# Patient Record
Sex: Female | Born: 1970 | Race: Black or African American | Hispanic: No | Marital: Married | Smoking: Former smoker
Health system: Southern US, Community
[De-identification: ages and names within clinical notes are randomized; demographics above are authoritative.]

## PROBLEM LIST (undated history)

## (undated) DIAGNOSIS — S83209A Unspecified tear of unspecified meniscus, current injury, unspecified knee, initial encounter: Secondary | ICD-10-CM

## (undated) DIAGNOSIS — M1990 Unspecified osteoarthritis, unspecified site: Secondary | ICD-10-CM

## (undated) DIAGNOSIS — I10 Essential (primary) hypertension: Secondary | ICD-10-CM

## (undated) HISTORY — DX: Morbid (severe) obesity due to excess calories: E66.01

## (undated) HISTORY — DX: Unspecified osteoarthritis, unspecified site: M19.90

## (undated) HISTORY — DX: Unspecified tear of unspecified meniscus, current injury, unspecified knee, initial encounter: S83.209A

## (undated) HISTORY — PX: TUBAL LIGATION: SHX77

---
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
## 2005-10-20 ENCOUNTER — Ambulatory Visit: Payer: Self-pay | Admitting: Family Medicine

## 2010-10-15 ENCOUNTER — Ambulatory Visit: Payer: Self-pay | Admitting: Family Medicine

## 2013-02-21 ENCOUNTER — Ambulatory Visit: Payer: Self-pay | Admitting: Family Medicine

## 2013-11-01 ENCOUNTER — Ambulatory Visit: Payer: Self-pay | Admitting: Family Medicine

## 2015-01-05 ENCOUNTER — Ambulatory Visit: Payer: Self-pay | Admitting: Family Medicine

## 2015-02-09 ENCOUNTER — Ambulatory Visit (INDEPENDENT_AMBULATORY_CARE_PROVIDER_SITE_OTHER): Payer: 59 | Admitting: Family Medicine

## 2015-02-09 ENCOUNTER — Encounter: Payer: Self-pay | Admitting: Family Medicine

## 2015-02-09 VITALS — BP 124/76 | HR 70 | Temp 98.2°F | Resp 16 | Ht 62.0 in | Wt 228.9 lb

## 2015-02-09 DIAGNOSIS — E66813 Obesity, class 3: Secondary | ICD-10-CM

## 2015-02-09 DIAGNOSIS — Z13220 Encounter for screening for lipoid disorders: Secondary | ICD-10-CM | POA: Diagnosis not present

## 2015-02-09 DIAGNOSIS — Z23 Encounter for immunization: Secondary | ICD-10-CM | POA: Insufficient documentation

## 2015-02-09 DIAGNOSIS — Z0000 Encounter for general adult medical examination without abnormal findings: Secondary | ICD-10-CM | POA: Insufficient documentation

## 2015-02-09 DIAGNOSIS — M654 Radial styloid tenosynovitis [de Quervain]: Secondary | ICD-10-CM | POA: Insufficient documentation

## 2015-02-09 DIAGNOSIS — Z1239 Encounter for other screening for malignant neoplasm of breast: Secondary | ICD-10-CM | POA: Insufficient documentation

## 2015-02-09 DIAGNOSIS — Z87898 Personal history of other specified conditions: Secondary | ICD-10-CM | POA: Insufficient documentation

## 2015-02-09 DIAGNOSIS — R030 Elevated blood-pressure reading, without diagnosis of hypertension: Secondary | ICD-10-CM | POA: Diagnosis not present

## 2015-02-09 DIAGNOSIS — L732 Hidradenitis suppurativa: Secondary | ICD-10-CM | POA: Insufficient documentation

## 2015-02-09 DIAGNOSIS — Z87891 Personal history of nicotine dependence: Secondary | ICD-10-CM | POA: Insufficient documentation

## 2015-02-09 DIAGNOSIS — M25529 Pain in unspecified elbow: Secondary | ICD-10-CM | POA: Insufficient documentation

## 2015-02-09 HISTORY — DX: Obesity, class 3: E66.813

## 2015-02-09 HISTORY — DX: Morbid (severe) obesity due to excess calories: E66.01

## 2015-02-09 NOTE — Progress Notes (Signed)
Name: Judy Robinson   MRN: [id number]    DOB: [birth year]   Date:02/09/2015       Progress Note  Subjective  Chief Complaint  Chief Complaint  Patient presents with  . Annual [place] form filled out    HPI   Judy Robinson is a 44 year old female with medical picture significant for obesity, pre-HTN, intermitent joint pain here for work physical and lab work that needs to be completed. She reports no acute concerns today. Judy Robinson has been doing well with no major changes to her health. She continues with weight loss efforts but walking and watching her diet but finds that she has not lost much weight. Previously smoking a handful of cigarettes now and then but has quite some time ago.   Patient Active Problem List   Diagnosis Date Noted  . Blood pressure elevated without history of HTN 02/09/2015  . H/O disease 02/09/2015  . Elbow pain 02/09/2015  . De Quervain's disease (radial styloid tenosynovitis) 02/09/2015  . Acne inversa 02/09/2015  . Obesity, Class III, BMI 40-49.9 (morbid obesity) 02/09/2015  . Need for diphtheria-tetanus-pertussis (Tdap) vaccine, adult/adolescent 02/09/2015  . Screening cholesterol level 02/09/2015  . Breast cancer screening 02/09/2015  . History of cigarette smoking 02/09/2015    History  Substance Use Topics  . Smoking status: Never Smoker   . Smokeless tobacco: Not on file  . Alcohol Use: 0.0 oz/week    0 Standard drinks or equivalent per week     Comment: ocassional    No current outpatient prescriptions on file.  Past Surgical History  Procedure Laterality Date  . Cesarean section    . Tubal ligation      Family History  Problem Relation Age of Onset  . Hypertension Mother   . Thyroid disease Mother   . Diabetes Brother   . Heart attack Brother     No Known Allergies  Depression screen [facility] 2/9 02/09/2015  Decreased Interest 0  Down, Depressed, Hopeless 0  PHQ - 2 Score 0    Review of  Systems  CONSTITUTIONAL: No significant weight changes, fever, chills, weakness or fatigue.  HEENT:  - Eyes: No visual changes.  - Ears: No auditory changes. No pain.  - Nose: No sneezing, congestion, runny nose. - Throat: No sore throat. No changes in swallowing. SKIN: No rash or itching.  CARDIOVASCULAR: No chest pain, chest pressure or chest discomfort. No palpitations or edema.  RESPIRATORY: No shortness of breath, cough or sputum.  GASTROINTESTINAL: No anorexia, nausea, vomiting. No changes in bowel habits. No abdominal pain or blood.  GENITOURINARY: No dysuria. No frequency. No discharge.  NEUROLOGICAL: No headache, dizziness, syncope, paralysis, ataxia, numbness or tingling in the extremities. No memory changes. No change in bowel or bladder control.  MUSCULOSKELETAL: No joint pain. No muscle pain. HEMATOLOGIC: No anemia, bleeding or bruising.  LYMPHATICS: No enlarged lymph nodes.  PSYCHIATRIC: No change in mood. No change in sleep pattern.  ENDOCRINOLOGIC: No reports of sweating, cold or heat intolerance. No polyuria or polydipsia.     Objective  BP 124/76 mmHg  Pulse 70  Temp(Src) 98.2 F (36.8 C) (Oral)  Resp 16  Ht 5\' 2"  (1.575 m)  Wt 228 lb 14.4 oz (103.828 kg)  BMI 41.86 kg/m2  SpO2 99%  LMP 02/09/2015 (Exact Date) Body mass index is 41.86 kg/(m^2). Waist circumference 42 inches  Physical Exam  Constitutional: Patient obese and well-nourished. In no distress.  HEENT:  - Head: Normocephalic and atraumatic.  - Ears: Bilateral TMs gray, no erythema or effusion - Nose: Nasal mucosa moist - Mouth/Throat: Oropharynx is clear and moist. No tonsillar hypertrophy or erythema. No post nasal drainage.  - Eyes: Conjunctivae clear, EOM movements normal. PERRLA. No scleral icterus.  Neck: Normal range of motion. Neck supple. No JVD present. No thyromegaly present.  Cardiovascular: Normal rate, regular rhythm and normal heart sounds.  No murmur heard.  Pulmonary/Chest:  Effort normal and breath sounds normal. No respiratory distress. Musculoskeletal: Normal range of motion bilateral UE and LE, no joint effusions. Peripheral vascular: Bilateral LE no edema. Neurological: CN II-XII grossly intact with no focal deficits. Alert and oriented to person, place, and time. Coordination, balance, strength, speech and gait are normal.  Skin: Skin is warm and dry. No rash noted. No erythema.  Psychiatric: Patient has a normal mood and affect. Behavior is normal in office today. Judgment and thought content normal in office today.   Assessment & Plan  1. Annual physical exam PAP done 02/07/2014, due for repeat mammogram and lab work.   2. Blood pressure elevated without history of HTN Improved since last check.  - CBC with Differential/Platelet - Comprehensive metabolic panel - Hemoglobin A1c - Lipid panel - TSH  3. Obesity, Class III, BMI 40-49.9 (morbid obesity) Last year around this time she was 233lbs and she has since lost about 5 lbs.  The patient has been counseled on their higher than normal BMI.  They have verbally expressed understanding their increased risk for other diseases.  In efforts to meet a better target BMI goal the patient has been counseled on lifestyle, diet and exercise modification tactics. Start with moderate intensity aerobic exercise (walking, jogging, elliptical, swimming, group or individual sports, hiking) at least 3mins a day at least 4 days a week and increase intensity, duration, frequency as tolerated. Diet should include well balance fresh fruits and vegetables avoiding processed foods, carbohydrates and sugars. Drink at least 8oz 10 glasses a day avoiding sodas, sugary fruit drinks, sweetened tea. Check weight on a reliable scale daily and monitor weight loss progress daily. Consider investing in mobile phone apps that will help keep track of weight loss goals.  - CBC with Differential/Platelet - Comprehensive metabolic panel -  Hemoglobin A1c - Lipid panel - TSH  4. Need for diphtheria-tetanus-pertussis (Tdap) vaccine, adult/adolescent  - Tdap vaccine greater than or equal to 7yo IM  5. Screening cholesterol level  - Lipid panel  6. Breast cancer screening  - MM Digital Screening; Future  7. History of cigarette smoking  - Nicotine/cotinine metabolites

## 2015-02-09 NOTE — Patient Instructions (Signed)
Fat and Cholesterol Control Diet Fat and cholesterol levels in your blood and organs are influenced by your diet. High levels of fat and cholesterol may lead to diseases of the heart, small and large blood vessels, gallbladder, liver, and pancreas. CONTROLLING FAT AND CHOLESTEROL WITH DIET Although exercise and lifestyle factors are important, your diet is key. That is because certain foods are known to raise cholesterol and others to lower it. The goal is to balance foods for their effect on cholesterol and more importantly, to replace saturated and trans fat with other types of fat, such as monounsaturated fat, polyunsaturated fat, and omega-3 fatty acids. On average, a person should consume no more than 15 to 17 g of saturated fat daily. Saturated and trans fats are considered "bad" fats, and they will raise LDL cholesterol. Saturated fats are primarily found in animal products such as meats, butter, and cream. However, that does not mean you need to give up all your favorite foods. Today, there are good tasting, low-fat, low-cholesterol substitutes for most of the things you like to eat. Choose low-fat or nonfat alternatives. Choose round or loin cuts of red meat. These types of cuts are lowest in fat and cholesterol. Chicken (without the skin), fish, veal, and ground turkey breast are great choices. Eliminate fatty meats, such as hot dogs and salami. Even shellfish have little or no saturated fat. Have a 3 oz (85 g) portion when you eat lean meat, poultry, or fish. Trans fats are also called "partially hydrogenated oils." They are oils that have been scientifically manipulated so that they are solid at room temperature resulting in a longer shelf life and improved taste and texture of foods in which they are added. Trans fats are found in stick margarine, some tub margarines, cookies, crackers, and baked goods.  When baking and cooking, oils are a great substitute for butter. The monounsaturated oils are  especially beneficial since it is believed they lower LDL and raise HDL. The oils you should avoid entirely are saturated tropical oils, such as coconut and palm.  Remember to eat a lot from food groups that are naturally free of saturated and trans fat, including fish, fruit, vegetables, beans, grains (barley, rice, couscous, bulgur wheat), and pasta (without cream sauces).  IDENTIFYING FOODS THAT LOWER FAT AND CHOLESTEROL  Soluble fiber may lower your cholesterol. This type of fiber is found in fruits such as apples, vegetables such as broccoli, potatoes, and carrots, legumes such as beans, peas, and lentils, and grains such as barley. Foods fortified with plant sterols (phytosterol) may also lower cholesterol. You should eat at least 2 g per day of these foods for a cholesterol lowering effect.  Read package labels to identify low-saturated fats, trans fat free, and low-fat foods at the supermarket. Select cheeses that have only 2 to 3 g saturated fat per ounce. Use a heart-healthy tub margarine that is free of trans fats or partially hydrogenated oil. When buying baked goods (cookies, crackers), avoid partially hydrogenated oils. Breads and muffins should be made from whole grains (whole-wheat or whole oat flour, instead of "flour" or "enriched flour"). Buy non-creamy canned soups with reduced salt and no added fats.  FOOD PREPARATION TECHNIQUES  Never deep-fry. If you must fry, either stir-fry, which uses very little fat, or use non-stick cooking sprays. When possible, broil, bake, or roast meats, and steam vegetables. Instead of putting butter or margarine on vegetables, use lemon and herbs, applesauce, and cinnamon (for squash and sweet potatoes). Use nonfat   yogurt, salsa, and low-fat dressings for salads.  LOW-SATURATED FAT / LOW-FAT FOOD SUBSTITUTES Meats / Saturated Fat (g)  Avoid: Steak, marbled (3 oz/85 g) / 11 g  Choose: Steak, lean (3 oz/85 g) / 4 g  Avoid: Hamburger (3 oz/85 g) / 7  g  Choose: Hamburger, lean (3 oz/85 g) / 5 g  Avoid: Ham (3 oz/85 g) / 6 g  Choose: Ham, lean cut (3 oz/85 g) / 2.4 g  Avoid: Chicken, with skin, dark meat (3 oz/85 g) / 4 g  Choose: Chicken, skin removed, dark meat (3 oz/85 g) / 2 g  Avoid: Chicken, with skin, light meat (3 oz/85 g) / 2.5 g  Choose: Chicken, skin removed, light meat (3 oz/85 g) / 1 g Dairy / Saturated Fat (g)  Avoid: Whole milk (1 cup) / 5 g  Choose: Low-fat milk, 2% (1 cup) / 3 g  Choose: Low-fat milk, 1% (1 cup) / 1.5 g  Choose: Skim milk (1 cup) / 0.3 g  Avoid: Hard cheese (1 oz/28 g) / 6 g  Choose: Skim milk cheese (1 oz/28 g) / 2 to 3 g  Avoid: Cottage cheese, 4% fat (1 cup) / 6.5 g  Choose: Low-fat cottage cheese, 1% fat (1 cup) / 1.5 g  Avoid: Ice cream (1 cup) / 9 g  Choose: Sherbet (1 cup) / 2.5 g  Choose: Nonfat frozen yogurt (1 cup) / 0.3 g  Choose: Frozen fruit bar / trace  Avoid: Whipped cream (1 tbs) / 3.5 g  Choose: Nondairy whipped topping (1 tbs) / 1 g Condiments / Saturated Fat (g)  Avoid: Mayonnaise (1 tbs) / 2 g  Choose: Low-fat mayonnaise (1 tbs) / 1 g  Avoid: Butter (1 tbs) / 7 g  Choose: Extra light margarine (1 tbs) / 1 g  Avoid: Coconut oil (1 tbs) / 11.8 g  Choose: Olive oil (1 tbs) / 1.8 g  Choose: Corn oil (1 tbs) / 1.7 g  Choose: Safflower oil (1 tbs) / 1.2 g  Choose: Sunflower oil (1 tbs) / 1.4 g  Choose: Soybean oil (1 tbs) / 2.4 g  Choose: Canola oil (1 tbs) / 1 g Document Released: 06/23/2005 Document Revised: 10/18/2012 Document Reviewed: 09/21/2013 Exit[employer] Patient Information 2015 [employer]. This information is not intended to replace advice given to you by your health care provider. Make sure you discuss any questions you have with your health care provider.  

## 2015-02-10 LAB — COMPREHENSIVE METABOLIC PANEL
ALT: 10 IU/L (ref 0–32)
AST: 12 IU/L (ref 0–40)
Albumin/Globulin Ratio: 1.4 (ref 1.1–2.5)
Albumin: 4.3 g/dL (ref 3.5–5.5)
Alkaline Phosphatase: 69 IU/L (ref 39–117)
BUN/Creatinine Ratio: 17 (ref 9–23)
BUN: 12 mg/dL (ref 6–24)
Bilirubin Total: 0.5 mg/dL (ref 0.0–1.2)
CO2: 25 mmol/L (ref 18–29)
Calcium: 9.6 mg/dL (ref 8.7–10.2)
Chloride: 102 mmol/L (ref 97–108)
Creatinine, Ser: 0.71 mg/dL (ref 0.57–1.00)
GFR calc Af Amer: 119 mL/min/{1.73_m2} (ref >59)
GFR calc non Af Amer: 103 mL/min/{1.73_m2} (ref >59)
Globulin, Total: 3 g/dL (ref 1.5–4.5)
Glucose: 85 mg/dL (ref 65–99)
Potassium: 4.3 mmol/L (ref 3.5–5.2)
Sodium: 142 mmol/L (ref 134–144)
Total Protein: 7.3 g/dL (ref 6.0–8.5)

## 2015-02-10 LAB — CBC WITH DIFFERENTIAL/PLATELET
Basophils Absolute: 0 10*3/uL (ref 0.0–0.2)
Basos: 1 %
EOS (ABSOLUTE): 0.3 10*3/uL (ref 0.0–0.4)
Eos: 4 %
Hematocrit: 38.1 % (ref 34.0–46.6)
Hemoglobin: 12.2 g/dL (ref 11.1–15.9)
Immature Grans (Abs): 0 10*3/uL (ref 0.0–0.1)
Immature Granulocytes: 0 %
Lymphocytes Absolute: 2.6 10*3/uL (ref 0.7–3.1)
Lymphs: 37 %
MCH: 29 pg (ref 26.6–33.0)
MCHC: 32 g/dL (ref 31.5–35.7)
MCV: 91 fL (ref 79–97)
Monocytes Absolute: 0.7 10*3/uL (ref 0.1–0.9)
Monocytes: 10 %
Neutrophils Absolute: 3.4 10*3/uL (ref 1.4–7.0)
Neutrophils: 48 %
Platelets: 248 10*3/uL (ref 150–379)
RBC: 4.2 x10E6/uL (ref 3.77–5.28)
RDW: 13.5 % (ref 12.3–15.4)
WBC: 7 10*3/uL (ref 3.4–10.8)

## 2015-02-10 LAB — LIPID PANEL
Chol/HDL Ratio: 4.4 ratio units (ref 0.0–4.4)
Cholesterol, Total: 183 mg/dL (ref 100–199)
HDL: 42 mg/dL (ref >39)
LDL Calculated: 121 mg/dL — ABNORMAL HIGH (ref 0–99)
Triglycerides: 99 mg/dL (ref 0–149)
VLDL Cholesterol Cal: 20 mg/dL (ref 5–40)

## 2015-02-10 LAB — HEMOGLOBIN A1C
Est. average glucose Bld gHb Est-mCnc: 114 mg/dL
Hgb A1c MFr Bld: 5.6 % (ref 4.8–5.6)

## 2015-02-10 LAB — TSH: TSH: 1.14 u[IU]/mL (ref 0.450–4.500)

## 2015-02-13 LAB — NICOTINE/COTININE METABOLITES
Cotinine: 2.4 ng/mL
Nicotine: NOT DETECTED ng/mL

## 2015-04-09 ENCOUNTER — Telehealth: Payer: Self-pay | Admitting: Family Medicine

## 2015-04-09 NOTE — Telephone Encounter (Signed)
Patient has appeal form to fill out for her health screening. She will be faxing the form in now.

## 2015-04-10 NOTE — Telephone Encounter (Signed)
Just completed the form.

## 2015-07-30 ENCOUNTER — Ambulatory Visit
Admission: RE | Admit: 2015-07-30 | Discharge: 2015-07-30 | Disposition: A | Discharge status: Home / Self Care | Payer: 59 | Source: Ambulatory Visit | Attending: Family Medicine | Admitting: Family Medicine

## 2015-07-30 DIAGNOSIS — Z1231 Encounter for screening mammogram for malignant neoplasm of breast: Secondary | ICD-10-CM | POA: Insufficient documentation

## 2015-07-30 DIAGNOSIS — Z1239 Encounter for other screening for malignant neoplasm of breast: Secondary | ICD-10-CM

## 2016-01-30 ENCOUNTER — Ambulatory Visit (INDEPENDENT_AMBULATORY_CARE_PROVIDER_SITE_OTHER): Payer: 59 | Admitting: Family Medicine

## 2016-01-30 ENCOUNTER — Encounter: Payer: Self-pay | Admitting: Family Medicine

## 2016-01-30 VITALS — BP 122/84 | HR 68 | Temp 99.0°F | Resp 14 | Ht 62.4 in | Wt 230.0 lb

## 2016-01-30 DIAGNOSIS — Z1211 Encounter for screening for malignant neoplasm of colon: Secondary | ICD-10-CM | POA: Diagnosis not present

## 2016-01-30 DIAGNOSIS — Z114 Encounter for screening for human immunodeficiency virus [HIV]: Secondary | ICD-10-CM | POA: Diagnosis not present

## 2016-01-30 DIAGNOSIS — Z0000 Encounter for general adult medical examination without abnormal findings: Secondary | ICD-10-CM

## 2016-01-30 NOTE — Assessment & Plan Note (Signed)
Discussed one-time HIV screening recommendation per USPSTF guidelines; patient agrees with testing; HIV antibody ordered 

## 2016-01-30 NOTE — Progress Notes (Signed)
BP 122/84   Pulse 68   Temp 99 F (37.2 C) (Oral)   Resp 14   Ht 5' 2.4" (1.585 m)   Wt 230 lb (104.3 kg)   LMP 01/26/2016   SpO2 98%   BMI 41.53 kg/m    Subjective:    Patient ID: Judy Robinson, female    DOB: 09/01/1970, 45 y.o.   MRN: [id number]  HPI: Judy Robinson is a 45 y.o. female  Chief Complaint  Patient presents with  . form    Bio metric screening   No medical excitement since last visit Here to get biometric screening done and labs for work Stuck at her weight; there is thyroid disease in the family; always cold; no problems with hair loss, dry skin, or constipation; feels tired when working; good energy; drinks 16-24 ounces of water at work, 8-16 ounces at home  Depression screen [facility] 2/9 01/30/2016 02/09/2015  Decreased Interest 0 0  Down, Depressed, Hopeless 0 0  PHQ - 2 Score 0 0   Relevant past medical, surgical, family and social history reviewed Past Medical History:  Diagnosis Date  . Obesity, Class III, BMI 40-49.9 (morbid obesity) ([place]) 02/09/2015   Tendonitis  Past Surgical History:  Procedure Laterality Date  . CESAREAN SECTION    . TUBAL LIGATION     Family History  Problem Relation Age of Onset  . Hypertension Mother   . Thyroid disease Mother   . Diabetes Brother   . Heart attack Brother   . Heart disease Father 46    heart attack  . Breast cancer Paternal Aunt    Social History  Substance Use Topics  . Smoking status: Former Smoker    Packs/day: 0.25    Years: 1.00    Quit date: 01/30/2011  . Smokeless tobacco: Never Used  . Alcohol use 0.0 oz/week     Comment: ocassional   Interim medical history since last visit reviewed. Allergies and medications reviewed  Review of Systems Per HPI unless specifically indicated above     Objective:    BP 122/84   Pulse 68   Temp 99 F (37.2 C) (Oral)   Resp 14   Ht 5' 2.4" (1.585 m)   Wt 230 lb (104.3 kg)   LMP 01/26/2016   SpO2 98%   BMI 41.53 kg/m   Wt  Readings from Last 3 Encounters:  01/30/16 230 lb (104.3 kg)  02/09/15 228 lb 14.4 oz (103.8 kg)    Physical Exam  Constitutional: She appears well-developed and well-nourished.  Morbidly obese  Cardiovascular: Normal rate and regular rhythm.   Pulmonary/Chest: Effort normal and breath sounds normal.  Neurological: She is alert.  Skin: Skin is warm. No pallor.  Psychiatric: She has a normal mood and affect.   Results for orders placed or performed in visit on 02/09/15  CBC with Differential/Platelet  Result Value Ref Range   WBC 7.0 3.4 - 10.8 x10E3/uL   RBC 4.20 3.77 - 5.28 x10E6/uL   Hemoglobin 12.2 11.1 - 15.9 g/dL   Hematocrit 38.1 34.0 - 46.6 %   MCV 91 79 - 97 fL   MCH 29.0 26.6 - 33.0 pg   MCHC 32.0 31.5 - 35.7 g/dL   RDW 13.5 12.3 - 15.4 %   Platelets 248 150 - 379 x10E3/uL   Neutrophils 48 %   Lymphs 37 %   Monocytes 10 %   Eos 4 %   Basos 1 %  Neutrophils Absolute 3.4 1.4 - 7.0 x10E3/uL   Lymphocytes Absolute 2.6 0.7 - 3.1 x10E3/uL   Monocytes Absolute 0.7 0.1 - 0.9 x10E3/uL   EOS (ABSOLUTE) 0.3 0.0 - 0.4 x10E3/uL   Basophils Absolute 0.0 0.0 - 0.2 x10E3/uL   Immature Granulocytes 0 %   Immature Grans (Abs) 0.0 0.0 - 0.1 x10E3/uL  Comprehensive metabolic panel  Result Value Ref Range   Glucose 85 65 - 99 mg/dL   BUN 12 6 - 24 mg/dL   Creatinine, Ser 0.71 0.57 - 1.00 mg/dL   GFR calc non Af Amer 103 >59 mL/min/1.73   GFR calc Af Amer 119 >59 mL/min/1.73   BUN/Creatinine Ratio 17 9 - 23   Sodium 142 134 - 144 mmol/L   Potassium 4.3 3.5 - 5.2 mmol/L   Chloride 102 97 - 108 mmol/L   CO2 25 18 - 29 mmol/L   Calcium 9.6 8.7 - 10.2 mg/dL   Total Protein 7.3 6.0 - 8.5 g/dL   Albumin 4.3 3.5 - 5.5 g/dL   Globulin, Total 3.0 1.5 - 4.5 g/dL   Albumin/Globulin Ratio 1.4 1.1 - 2.5   Bilirubin Total 0.5 0.0 - 1.2 mg/dL   Alkaline Phosphatase 69 39 - 117 IU/L   AST 12 0 - 40 IU/L   ALT 10 0 - 32 IU/L  Hemoglobin A1c  Result Value Ref Range   Hgb A1c MFr Bld 5.6  4.8 - 5.6 %   Est. average glucose Bld gHb Est-mCnc 114 mg/dL  Lipid panel  Result Value Ref Range   Cholesterol, Total 183 100 - 199 mg/dL   Triglycerides 99 0 - 149 mg/dL   HDL 42 >39 mg/dL   VLDL Cholesterol Cal 20 5 - 40 mg/dL   LDL Calculated 121 (H) 0 - 99 mg/dL   Chol/HDL Ratio 4.4 0.0 - 4.4 ratio units  TSH  Result Value Ref Range   TSH 1.140 0.450 - 4.500 uIU/mL  Nicotine/cotinine metabolites  Result Value Ref Range   Nicotine None Detected ng/mL   Cotinine 2.4 ng/mL      Assessment & Plan:   Problem List Items Addressed This Visit      Other   Preventative health care    Check fasting labs today for her biometric screening; she reports being UTD on paps and mammograms; colonoscopy due (age 87+, AA descent); tetanus UTD      Relevant Orders   Hemoglobin A1c   Comprehensive metabolic panel   CBC with Differential/Platelet   T4 AND TSH   Lipid panel   HIV antibody   Obesity, Class III, BMI 40-49.9 (morbid obesity) ([place]) - Primary    See AVS; encouraged patient to work on weight loss; printed off the [email or web address] page on weight loss; don't skip meals, drink 64 ounces of water a day, etc.      Encounter for screening for HIV    Discussed one-time HIV screening recommendation per USPSTF guidelines; patient agrees with testing; HIV antibody ordered      Colon cancer screening    Order colonoscopy      Relevant Orders   Ambulatory referral to Gastroenterology    Other Visit Diagnoses   None.     Follow up plan: Return in about 1 year (around 01/29/2017) for biometric screening.  An after-visit summary was printed and given to the patient at [place].  Please see the patient instructions which may contain other information and recommendations beyond what is mentioned above in the assessment and plan.  Face-to-face time with patient was more than 15 minutes, >50% time spent counseling and coordination of care  Orders Placed This Encounter  Procedures    . Hemoglobin A1c  . Comprehensive metabolic panel  . CBC with Differential/Platelet  . T4 AND TSH  . Lipid panel  . HIV antibody  . Ambulatory referral to Gastroenterology

## 2016-01-30 NOTE — Assessment & Plan Note (Signed)
See AVS; encouraged patient to work on weight loss; printed off the [email or web address] page on weight loss; don't skip meals, drink 64 ounces of water a day, etc.

## 2016-01-30 NOTE — Assessment & Plan Note (Addendum)
Check fasting labs today for her biometric screening; she reports being UTD on paps and mammograms; colonoscopy due (age 46+, AA descent); tetanus UTD

## 2016-01-30 NOTE — Assessment & Plan Note (Signed)
Order colonoscopy

## 2016-01-30 NOTE — Patient Instructions (Addendum)
Check out the information at familydoctor.org entitled "Nutrition for Weight Loss: What You Need to Know about Fad Diets" Try to lose between 1-2 pounds per week by taking in fewer calories and burning off more calories You can succeed by limiting portions, limiting foods dense in calories and fat, becoming more active, and drinking 8 glasses of water a day (64 ounces) Don't skip meals, especially breakfast, as skipping meals may alter your metabolism Do not use over-the-counter weight loss pills or gimmicks that claim rapid weight loss A healthy BMI (or body mass index) is between 18.5 and 24.9 You can calculate your ideal BMI at the [place] website [employer]fr   Health Maintenance, Female Adopting a healthy lifestyle and getting preventive care can go a long way to promote health and wellness. Talk with your health care provider about what schedule of regular examinations is right for you. This is a good chance for you to check in with your provider about disease prevention and staying healthy. In between checkups, there are plenty of things you can do on your own. Experts have done a lot of research about which lifestyle changes and preventive measures are most likely to keep you healthy. Ask your health care provider for more information. WEIGHT AND DIET  Eat a healthy diet  Be sure to include plenty of vegetables, fruits, low-fat dairy products, and lean protein.  Do not eat a lot of foods high in solid fats, added sugars, or salt.  Get regular exercise. This is one of the most important things you can do for your health.  Most adults should exercise for at least 150 minutes each week. The exercise should increase your heart rate and make you sweat (moderate-intensity exercise).  Most adults should also do strengthening exercises at least twice a week. This is in addition to the moderate-intensity exercise.  Maintain a healthy  weight  Body mass index (BMI) is a measurement that can be used to identify possible weight problems. It estimates body fat based on height and weight. Your health care provider can help determine your BMI and help you achieve or maintain a healthy weight.  For females 49 years of age and older:   A BMI below 18.5 is considered underweight.  A BMI of 18.5 to 24.9 is normal.  A BMI of 25 to 29.9 is considered overweight.  A BMI of 30 and above is considered obese.  Watch levels of cholesterol and blood lipids  You should start having your blood tested for lipids and cholesterol at 46 years of age, then have this test every 5 years.  You may need to have your cholesterol levels checked more often if:  Your lipid or cholesterol levels are high.  You are older than 46 years of age.  You are at high risk for heart disease.  CANCER SCREENING   Lung Cancer  Lung cancer screening is recommended for adults 49-45 years old who are at high risk for lung cancer because of a history of smoking.  A yearly low-dose CT scan of the lungs is recommended for people who:  Currently smoke.  Have quit within the past 15 years.  Have at least a 30-pack-year history of smoking. A pack year is smoking an average of one pack of cigarettes a day for 1 year.  Yearly screening should continue until it has been 15 years since you quit.  Yearly screening should stop if you develop a health problem that would prevent you from having lung cancer  treatment.  Breast Cancer  Practice breast self-awareness. This means understanding how your breasts normally appear and feel.  It also means doing regular breast self-exams. Let your health care provider know about any changes, no matter how small.  If you are in your 20s or 30s, you should have a clinical breast exam (CBE) by a health care provider every 1-3 years as part of a regular health exam.  If you are 29 or older, have a CBE every year. Also  consider having a breast X-ray (mammogram) every year.  If you have a family history of breast cancer, talk to your health care provider about genetic screening.  If you are at high risk for breast cancer, talk to your health care provider about having an MRI and a mammogram every year.  Breast cancer gene (BRCA) assessment is recommended for women who have family members with BRCA-related cancers. BRCA-related cancers include:  Breast.  Ovarian.  Tubal.  Peritoneal cancers.  Results of the assessment will determine the need for genetic counseling and BRCA1 and BRCA2 testing. Cervical Cancer Your health care provider may recommend that you be screened regularly for cancer of the pelvic organs (ovaries, uterus, and vagina). This screening involves a pelvic examination, including checking for microscopic changes to the surface of your cervix (Pap test). You may be encouraged to have this screening done every 3 years, beginning at age 25.  For women ages 66-65, health care providers may recommend pelvic exams and Pap testing every 3 years, or they may recommend the Pap and pelvic exam, combined with testing for human papilloma virus (HPV), every 5 years. Some types of HPV increase your risk of cervical cancer. Testing for HPV may also be done on women of any age with unclear Pap test results.  Other health care providers may not recommend any screening for nonpregnant women who are considered low risk for pelvic cancer and who do not have symptoms. Ask your health care provider if a screening pelvic exam is right for you.  If you have had past treatment for cervical cancer or a condition that could lead to cancer, you need Pap tests and screening for cancer for at least 20 years after your treatment. If Pap tests have been discontinued, your risk factors (such as having a new sexual partner) need to be reassessed to determine if screening should resume. Some women have medical problems that  increase the chance of getting cervical cancer. In these cases, your health care provider may recommend more frequent screening and Pap tests. Colorectal Cancer  This type of cancer can be detected and often prevented.  Routine colorectal cancer screening usually begins at 46 years of age and continues through 46 years of age.  Your health care provider may recommend screening at an earlier age if you have risk factors for colon cancer.  Your health care provider may also recommend using home test kits to check for hidden blood in the stool.  A small camera at the end of a tube can be used to examine your colon directly (sigmoidoscopy or colonoscopy). This is done to check for the earliest forms of colorectal cancer.  Routine screening usually begins at age 91.  Direct examination of the colon should be repeated every 5-10 years through 46 years of age. However, you may need to be screened more often if early forms of precancerous polyps or small growths are found. Skin Cancer  Check your skin from head to toe regularly.  Tell your  health care provider about any new moles or changes in moles, especially if there is a change in a mole's shape or color.  Also tell your health care provider if you have a mole that is larger than the size of a pencil eraser.  Always use sunscreen. Apply sunscreen liberally and repeatedly throughout the day.  Protect yourself by wearing long sleeves, pants, a wide-brimmed hat, and sunglasses whenever you are outside. HEART DISEASE, DIABETES, AND HIGH BLOOD PRESSURE   High blood pressure causes heart disease and increases the risk of stroke. High blood pressure is more likely to develop in:  People who have blood pressure in the high end of the normal range (130-139/85-89 mm Hg).  People who are overweight or obese.  People who are African American.  If you are 61-10 years of age, have your blood pressure checked every 3-5 years. If you are 21 years of  age or older, have your blood pressure checked every year. You should have your blood pressure measured twice--once when you are at a hospital or clinic, and once when you are not at a hospital or clinic. Record the average of the two measurements. To check your blood pressure when you are not at a hospital or clinic, you can use:  An automated blood pressure machine at a pharmacy.  A home blood pressure monitor.  If you are between 46 years and 52 years old, ask your health care provider if you should take aspirin to prevent strokes.  Have regular diabetes screenings. This involves taking a blood sample to check your fasting blood sugar level.  If you are at a normal weight and have a low risk for diabetes, have this test once every three years after 46 years of age.  If you are overweight and have a high risk for diabetes, consider being tested at a younger age or more often. PREVENTING INFECTION  Hepatitis B  If you have a higher risk for hepatitis B, you should be screened for this virus. You are considered at high risk for hepatitis B if:  You were born in a country where hepatitis B is common. Ask your health care provider which countries are considered high risk.  Your parents were born in a high-risk country, and you have not been immunized against hepatitis B (hepatitis B vaccine).  You have HIV or AIDS.  You use needles to inject street drugs.  You live with someone who has hepatitis B.  You have had sex with someone who has hepatitis B.  You get hemodialysis treatment.  You take certain medicines for conditions, including cancer, organ transplantation, and autoimmune conditions. Hepatitis C  Blood testing is recommended for:  Everyone born from [birth year] through 1966.  Anyone with known risk factors for hepatitis C. Sexually transmitted infections (STIs)  You should be screened for sexually transmitted infections (STIs) including gonorrhea and chlamydia if:  You are  sexually active and are younger than 46 years of age.  You are older than 46 years of age and your health care provider tells you that you are at risk for this type of infection.  Your sexual activity has changed since you were last screened and you are at an increased risk for chlamydia or gonorrhea. Ask your health care provider if you are at risk.  If you do not have HIV, but are at risk, it may be recommended that you take a prescription medicine daily to prevent HIV infection. This is called pre-exposure prophylaxis (PrEP).  You are considered at risk if:  You are sexually active and do not regularly use condoms or know the HIV status of your partner(s).  You take drugs by injection.  You are sexually active with a partner who has HIV. Talk with your health care provider about whether you are at high risk of being infected with HIV. If you choose to begin PrEP, you should first be tested for HIV. You should then be tested every 3 months for as long as you are taking PrEP.  PREGNANCY   If you are premenopausal and you may become pregnant, ask your health care provider about preconception counseling.  If you may become pregnant, take 400 to 800 micrograms (mcg) of folic acid every day.  If you want to prevent pregnancy, talk to your health care provider about birth control (contraception). OSTEOPOROSIS AND MENOPAUSE   Osteoporosis is a disease in which the bones lose minerals and strength with aging. This can result in serious bone fractures. Your risk for osteoporosis can be identified using a bone density scan.  If you are 69 years of age or older, or if you are at risk for osteoporosis and fractures, ask your health care provider if you should be screened.  Ask your health care provider whether you should take a calcium or vitamin D supplement to lower your risk for osteoporosis.  Menopause may have certain physical symptoms and risks.  Hormone replacement therapy may reduce some  of these symptoms and risks. Talk to your health care provider about whether hormone replacement therapy is right for you.  HOME CARE INSTRUCTIONS   Schedule regular health, dental, and eye exams.  Stay current with your immunizations.   Do not use any tobacco products including cigarettes, chewing tobacco, or electronic cigarettes.  If you are pregnant, do not drink alcohol.  If you are breastfeeding, limit how much and how often you drink alcohol.  Limit alcohol intake to no more than 1 drink per day for nonpregnant women. One drink equals 12 ounces of beer, 5 ounces of wine, or 1 ounces of hard liquor.  Do not use street drugs.  Do not share needles.  Ask your health care provider for help if you need support or information about quitting drugs.  Tell your health care provider if you often feel depressed.  Tell your health care provider if you have ever been abused or do not feel safe at home.   This information is not intended to replace advice given to you by your health care provider. Make sure you discuss any questions you have with your health care provider.   Document Released: 01/06/2011 Document Revised: 07/14/2014 Document Reviewed: 05/25/2013 Elsevier Interactive Patient Education [employer].

## 2016-01-31 LAB — COMPREHENSIVE METABOLIC PANEL
ALT: 12 IU/L (ref 0–32)
AST: 19 IU/L (ref 0–40)
Albumin/Globulin Ratio: 1.4 (ref 1.2–2.2)
Albumin: 4.3 g/dL (ref 3.5–5.5)
Alkaline Phosphatase: 72 IU/L (ref 39–117)
BUN/Creatinine Ratio: 13 (ref 9–23)
BUN: 11 mg/dL (ref 6–24)
Bilirubin Total: 0.4 mg/dL (ref 0.0–1.2)
CO2: 19 mmol/L (ref 18–29)
Calcium: 9.3 mg/dL (ref 8.7–10.2)
Chloride: 103 mmol/L (ref 96–106)
Creatinine, Ser: 0.83 mg/dL (ref 0.57–1.00)
GFR calc Af Amer: 98 mL/min/{1.73_m2} (ref >59)
GFR calc non Af Amer: 85 mL/min/{1.73_m2} (ref >59)
Globulin, Total: 3.1 g/dL (ref 1.5–4.5)
Glucose: 103 mg/dL — ABNORMAL HIGH (ref 65–99)
Potassium: 4.6 mmol/L (ref 3.5–5.2)
Sodium: 143 mmol/L (ref 134–144)
Total Protein: 7.4 g/dL (ref 6.0–8.5)

## 2016-01-31 LAB — CBC WITH DIFFERENTIAL/PLATELET
Basophils Absolute: 0 10*3/uL (ref 0.0–0.2)
Basos: 1 %
EOS (ABSOLUTE): 0.3 10*3/uL (ref 0.0–0.4)
Eos: 4 %
Hematocrit: 39.6 % (ref 34.0–46.6)
Hemoglobin: 12.8 g/dL (ref 11.1–15.9)
Immature Grans (Abs): 0 10*3/uL (ref 0.0–0.1)
Immature Granulocytes: 0 %
Lymphocytes Absolute: 2.5 10*3/uL (ref 0.7–3.1)
Lymphs: 40 %
MCH: 29.5 pg (ref 26.6–33.0)
MCHC: 32.3 g/dL (ref 31.5–35.7)
MCV: 91 fL (ref 79–97)
Monocytes Absolute: 0.6 10*3/uL (ref 0.1–0.9)
Monocytes: 9 %
Neutrophils Absolute: 3 10*3/uL (ref 1.4–7.0)
Neutrophils: 46 %
Platelets: 272 10*3/uL (ref 150–379)
RBC: 4.34 x10E6/uL (ref 3.77–5.28)
RDW: 13.4 % (ref 12.3–15.4)
WBC: 6.3 10*3/uL (ref 3.4–10.8)

## 2016-01-31 LAB — LIPID PANEL
Chol/HDL Ratio: 4.6 ratio units — ABNORMAL HIGH (ref 0.0–4.4)
Cholesterol, Total: 190 mg/dL (ref 100–199)
HDL: 41 mg/dL (ref >39)
LDL Calculated: 123 mg/dL — ABNORMAL HIGH (ref 0–99)
Triglycerides: 130 mg/dL (ref 0–149)
VLDL Cholesterol Cal: 26 mg/dL (ref 5–40)

## 2016-01-31 LAB — T4 AND TSH
T4, Total: 6.8 ug/dL (ref 4.5–12.0)
TSH: 1.17 u[IU]/mL (ref 0.450–4.500)

## 2016-01-31 LAB — HEMOGLOBIN A1C
Est. average glucose Bld gHb Est-mCnc: 108 mg/dL
Hgb A1c MFr Bld: 5.4 % (ref 4.8–5.6)

## 2016-01-31 LAB — HIV ANTIBODY (ROUTINE TESTING W REFLEX): HIV Screen 4th Generation wRfx: NONREACTIVE

## 2016-02-01 ENCOUNTER — Other Ambulatory Visit: Payer: Self-pay

## 2016-02-01 ENCOUNTER — Telehealth: Payer: Self-pay

## 2016-02-01 NOTE — Telephone Encounter (Signed)
Screening Colonoscopy Z12.11 [facility] 03/25/2016  Please pre cert  

## 2016-02-01 NOTE — Telephone Encounter (Signed)
Gastroenterology Pre-Procedure Review  Request Date: 03/25/2016 Requesting Physician: Dr. Sanda Klein  PATIENT REVIEW QUESTIONS: The patient responded to the following health history questions as indicated:    1. Are you having any GI issues? no 2. Do you have a personal history of Polyps? no 3. Do you have a family history of Colon Cancer or Polyps? no 4. Diabetes Mellitus? no 5. Joint replacements in the past 12 months?no 6. Major health problems in the past 3 months?no 7. Any artificial heart valves, MVP, or defibrillator?no    MEDICATIONS & ALLERGIES:    Patient reports the following regarding taking any anticoagulation/antiplatelet therapy:   Plavix, Coumadin, Eliquis, Xarelto, Lovenox, Pradaxa, Brilinta, or Effient? no Aspirin? no  Patient confirms/reports the following medications:  No current outpatient prescriptions on file.   No current facility-administered medications for this visit.     Patient confirms/reports the following allergies:  No Known Allergies  No orders of the defined types were placed in this encounter.   AUTHORIZATION INFORMATION Primary Insurance: 1D#: Group #:  Secondary Insurance: 1D#: Group #:  SCHEDULE INFORMATION: Date: 03/25/2016 Time: Location: [facility]

## 2016-02-06 ENCOUNTER — Telehealth: Payer: Self-pay | Admitting: Family Medicine

## 2016-02-07 NOTE — Telephone Encounter (Signed)
refaxed with labs

## 2016-03-25 ENCOUNTER — Ambulatory Visit
Admission: RE | Admit: 2016-03-25 | Discharge: 2016-03-25 | Disposition: A | Discharge status: Home / Self Care | Payer: 59 | Source: Ambulatory Visit | Attending: Gastroenterology | Admitting: Gastroenterology

## 2016-03-25 ENCOUNTER — Encounter
Admission: RE | Disposition: A | Discharge status: Home / Self Care | Payer: Self-pay | Source: Ambulatory Visit | Attending: Gastroenterology

## 2016-03-25 ENCOUNTER — Ambulatory Visit: Payer: 59 | Admitting: Certified Registered Nurse Anesthetist

## 2016-03-25 DIAGNOSIS — D125 Benign neoplasm of sigmoid colon: Secondary | ICD-10-CM

## 2016-03-25 DIAGNOSIS — Z1211 Encounter for screening for malignant neoplasm of colon: Secondary | ICD-10-CM | POA: Diagnosis present

## 2016-03-25 DIAGNOSIS — Z87891 Personal history of nicotine dependence: Secondary | ICD-10-CM | POA: Insufficient documentation

## 2016-03-25 DIAGNOSIS — K640 First degree hemorrhoids: Secondary | ICD-10-CM | POA: Diagnosis not present

## 2016-03-25 DIAGNOSIS — K5730 Diverticulosis of large intestine without perforation or abscess without bleeding: Secondary | ICD-10-CM | POA: Diagnosis not present

## 2016-03-25 DIAGNOSIS — Z6841 Body Mass Index (BMI) 40.0 and over, adult: Secondary | ICD-10-CM | POA: Insufficient documentation

## 2016-03-25 DIAGNOSIS — K635 Polyp of colon: Secondary | ICD-10-CM | POA: Diagnosis not present

## 2016-03-25 HISTORY — PX: COLONOSCOPY WITH PROPOFOL: SHX5780

## 2016-03-25 LAB — POCT PREGNANCY, URINE: Preg Test, Ur: NEGATIVE

## 2016-03-25 SURGERY — COLONOSCOPY WITH PROPOFOL
Anesthesia: General

## 2016-03-25 MED ORDER — LIDOCAINE HCL (CARDIAC) 20 MG/ML IV SOLN
INTRAVENOUS | Status: DC | PRN
  Administered 2016-03-25: 40 mg via INTRAVENOUS

## 2016-03-25 MED ORDER — PROPOFOL 500 MG/50ML IV EMUL
INTRAVENOUS | Status: DC | PRN
  Administered 2016-03-25: 140 ug/kg/min via INTRAVENOUS

## 2016-03-25 MED ORDER — SODIUM CHLORIDE 0.9 % IV SOLN
INTRAVENOUS | Status: DC
  Administered 2016-03-25: 09:00:00 via INTRAVENOUS

## 2016-03-25 MED ORDER — PROPOFOL 10 MG/ML IV BOLUS
INTRAVENOUS | Status: DC | PRN
  Administered 2016-03-25: 70 mg via INTRAVENOUS

## 2016-03-25 NOTE — Transfer of Care (Signed)
Immediate Anesthesia Transfer of Care Note  Patient: Judy Robinson  Procedure(s) Performed: Procedure(s): COLONOSCOPY WITH PROPOFOL (N/A)  Patient Location: PACU  Anesthesia Type:General  Level of Consciousness: awake, alert  and oriented  Airway & Oxygen Therapy: Patient Spontanous Breathing and Patient connected to nasal cannula oxygen  Post-op Assessment: Report given to RN and Post -op Vital signs reviewed and stable  Post vital signs: Reviewed and stable  Last Vitals:  Vitals:   03/25/16 0850 03/25/16 0930  BP: (!) 142/88   Pulse: (!) 59   Resp: 17   Temp: 36.8 C 36.2 C    Last Pain:  Vitals:   03/25/16 0930  TempSrc: Axillary         Complications: No apparent anesthesia complications

## 2016-03-25 NOTE — H&P (Signed)
  Lucilla Lame, MD [facility] [address] [place], [place] [address] Phone: [phone] Fax : [phone]  Primary Care Physician:  Enid Derry, MD Primary Gastroenterologist:  Dr. Allen Norris  Pre-Procedure History & Physical: HPI:  Judy Robinson is a 45 y.o. female is here for a screening colonoscopy.   Past Medical History:  Diagnosis Date  . Obesity, Class III, BMI 40-49.9 (morbid obesity) ([place]) 02/09/2015    Past Surgical History:  Procedure Laterality Date  . CESAREAN SECTION    . TUBAL LIGATION      Prior to Admission medications   Not on File    Allergies as of 02/01/2016  . (No Known Allergies)    Family History  Problem Relation Age of Onset  . Hypertension Mother   . Thyroid disease Mother   . Diabetes Brother   . Heart attack Brother   . Heart disease Father 79    heart attack  . Breast cancer Paternal Aunt     Social History   Social History  . Marital status: Married    Spouse name: N/A  . Number of children: N/A  . Years of education: N/A   Occupational History  . Not on file.   Social History Main Topics  . Smoking status: Former Smoker    Packs/day: 0.25    Years: 1.00    Quit date: 01/30/2011  . Smokeless tobacco: Never Used  . Alcohol use 0.0 oz/week     Comment: ocassional  . Drug use: No  . Sexual activity: Yes    Partners: Male   Other Topics Concern  . Not on file   Social History Narrative  . No narrative on file    Review of Systems: See HPI, otherwise negative ROS  Physical Exam: BP (!) 142/88   Pulse (!) 59   Temp 98.3 F (36.8 C) (Tympanic)   Resp 17   Ht 5\' 2"  (1.575 m)   Wt 230 lb (104.3 kg)   SpO2 100%   BMI 42.07 kg/m  General:   Alert,  pleasant and cooperative in NAD Head:  Normocephalic and atraumatic. Neck:  Supple; no masses or thyromegaly. Lungs:  Clear throughout to auscultation.    Heart:  Regular rate and rhythm. Abdomen:  Soft, nontender and nondistended. Normal bowel sounds,  without guarding, and without rebound.   Neurologic:  Alert and  oriented x4;  grossly normal neurologically.  Impression/Plan: Judy Robinson is now here to undergo a screening colonoscopy.  Risks, benefits, and alternatives regarding colonoscopy have been reviewed with the patient.  Questions have been answered.  All parties agreeable.

## 2016-03-25 NOTE — Anesthesia Postprocedure Evaluation (Signed)
Anesthesia Post Note  Patient: Judy Robinson  Procedure(s) Performed: Procedure(s) (LRB): COLONOSCOPY WITH PROPOFOL (N/A)  Patient location during evaluation: Endoscopy Anesthesia Type: General Level of consciousness: awake and alert Pain management: pain level controlled Vital Signs Assessment: post-procedure vital signs reviewed and stable Respiratory status: spontaneous breathing and respiratory function stable Cardiovascular status: stable Anesthetic complications: no    Last Vitals:  Vitals:   03/25/16 0850 03/25/16 0930  BP: (!) 142/88   Pulse: (!) 59   Resp: 17   Temp: 36.8 C 36.2 C    Last Pain:  Vitals:   03/25/16 0930  TempSrc: Axillary                 Neamiah Sciarra K

## 2016-03-25 NOTE — Op Note (Signed)
[facility] Gastroenterology Patient Name: Judy Robinson Procedure Date: 03/25/2016 9:08 AM MRN: [id number] Account #: [id number] Date of Birth: 07/08/1970 Admit Type: Outpatient Age: 45 Room: [facility] ENDO ROOM 4 Gender: Female Note Status: Finalized Procedure:            Colonoscopy Indications:          Screening for colorectal malignant neoplasm Providers:            Lucilla Lame MD, MD Referring MD:         Arnetha Courser (Referring MD) Medicines:            Propofol per Anesthesia Complications:        No immediate complications. Procedure:            Pre-Anesthesia Assessment:                       - Prior to the procedure, a History and Physical was                        performed, and patient medications and allergies were                        reviewed. The patient's tolerance of previous                        anesthesia was also reviewed. The risks and benefits of                        the procedure and the sedation options and risks were                        discussed with the patient. All questions were                        answered, and informed consent was obtained. Prior                        Anticoagulants: The patient has taken no previous                        anticoagulant or antiplatelet agents. ASA Grade                        Assessment: II - A patient with mild systemic disease.                        After reviewing the risks and benefits, the patient was                        deemed in satisfactory condition to undergo the                        procedure.                       After obtaining informed consent, the colonoscope was                        passed under direct vision. Throughout the procedure,  the patient's blood pressure, pulse, and oxygen                        saturations were monitored continuously. The                        Colonoscope was introduced through the anus and            advanced to the the cecum, identified by appendiceal                        orifice and ileocecal valve. The colonoscopy was                        performed without difficulty. The patient tolerated the                        procedure well. The quality of the bowel preparation                        was excellent. Findings:      The perianal and digital rectal examinations were normal.      Three sessile polyps were found in the sigmoid colon. The polyps were 4       to 5 mm in size. These polyps were removed with a cold snare. Resection       and retrieval were complete.      A single small-mouthed diverticulum was found in the ascending colon.      Non-bleeding internal hemorrhoids were found during retroflexion. The       hemorrhoids were Grade I (internal hemorrhoids that do not prolapse). Impression:           - Three 4 to 5 mm polyps in the sigmoid colon, removed                        with a cold snare. Resected and retrieved.                       - Diverticulosis in the ascending colon.                       - Non-bleeding internal hemorrhoids. Recommendation:       - Await pathology results.                       - Repeat colonoscopy in 5 years if polyp adenoma and 10                        years if hyperplastic Procedure Code(s):    --- Professional ---                       [phone], Colonoscopy, flexible; with removal of tumor(s),                        polyp(s), or other lesion(s) by snare technique Diagnosis Code(s):    --- Professional ---                       Z12.11, Encounter for screening for malignant neoplasm  of colon                       D12.5, Benign neoplasm of sigmoid colon CPT copyright 2016 American Medical Association. All rights reserved. The codes documented in this report are preliminary and upon coder review may  be revised to meet current compliance requirements. Lucilla Lame MD, MD 03/25/2016 9:27:40 AM This report has  been signed electronically. Number of Addenda: 0 Note Initiated On: 03/25/2016 9:08 AM Scope Withdrawal Time: 0 hours 6 minutes 20 seconds  Total Procedure Duration: 0 hours 9 minutes 55 seconds       [facility]

## 2016-03-25 NOTE — Anesthesia Procedure Notes (Signed)
Performed by: Cera Rorke Pre-anesthesia Checklist: Patient identified, Suction available, Emergency Drugs available, Patient being monitored and Timeout performed Patient Re-evaluated:Patient Re-evaluated prior to inductionOxygen Delivery Method: Nasal cannula Intubation Type: IV induction       

## 2016-03-25 NOTE — Anesthesia Preprocedure Evaluation (Signed)
Anesthesia Evaluation  Patient identified by MRN, date of birth, ID band Patient awake    Reviewed: Allergy & Precautions, NPO status , Patient's Chart, lab work & pertinent test results  History of Anesthesia Complications Negative for: history of anesthetic complications  Airway Mallampati: II       Dental   Pulmonary neg pulmonary ROS, former smoker,           Cardiovascular negative cardio ROS       Neuro/Psych negative neurological ROS     GI/Hepatic negative GI ROS, Neg liver ROS,   Endo/Other  negative endocrine ROS  Renal/GU negative Renal ROS     Musculoskeletal   Abdominal   Peds  Hematology negative hematology ROS (+)   Anesthesia Other Findings   Reproductive/Obstetrics                             Anesthesia Physical Anesthesia Plan  ASA: I  Anesthesia Plan: General   Post-op Pain Management:    Induction: Intravenous  Airway Management Planned: Nasal Cannula  Additional Equipment:   Intra-op Plan:   Post-operative Plan:   Informed Consent: I have reviewed the patients History and Physical, chart, labs and discussed the procedure including the risks, benefits and alternatives for the proposed anesthesia with the patient or authorized representative who has indicated his/her understanding and acceptance.     Plan Discussed with:   Anesthesia Plan Comments:         Anesthesia Quick Evaluation  

## 2016-03-26 ENCOUNTER — Encounter: Payer: Self-pay | Admitting: Gastroenterology

## 2016-03-28 LAB — SURGICAL PATHOLOGY

## 2016-04-01 ENCOUNTER — Encounter: Payer: Self-pay | Admitting: Gastroenterology

## 2016-04-17 ENCOUNTER — Telehealth: Payer: Self-pay | Admitting: Family Medicine

## 2016-04-17 NOTE — Telephone Encounter (Signed)
Pt wants to know if you received her [employer] paperwork that was faxed over to our office on Monday? Please advise.

## 2016-04-17 NOTE — Telephone Encounter (Signed)
Called and confirmed with patient we did receive, just waiting on you to fill out.

## 2016-04-24 ENCOUNTER — Telehealth: Payer: Self-pay | Admitting: Family Medicine

## 2016-04-24 NOTE — Telephone Encounter (Signed)
Patient requested appeal form for obesity; it is ready (in fax tray) Note says in essence: Will have her try to lose 1-2 pounds per week, which will be at least 52 pounds in one year She should reach at least 178 pounds by January 29, 2017 (we talked about formula for weight loss on January 30, 2016) Will refer to nutritionist Start increasing activity, building up to 150 minutes of exercise per week within 3 months We don't plan to do a second appeal next summer so start working hard now and she should have this no problem Good luck! Thank you

## 2016-04-24 NOTE — Assessment & Plan Note (Signed)
Refer to nutritionist 

## 2016-04-25 NOTE — Telephone Encounter (Signed)
Pt and her husband notified.   Thanks

## 2016-05-26 ENCOUNTER — Ambulatory Visit: Payer: 59 | Admitting: Dietician

## 2016-06-04 ENCOUNTER — Encounter: Payer: 59 | Attending: Family Medicine | Admitting: Dietician

## 2016-06-04 ENCOUNTER — Encounter: Payer: Self-pay | Admitting: Dietician

## 2016-06-04 VITALS — Ht 62.0 in | Wt 233.6 lb

## 2016-06-04 DIAGNOSIS — Z713 Dietary counseling and surveillance: Secondary | ICD-10-CM | POA: Insufficient documentation

## 2016-06-04 DIAGNOSIS — Z6841 Body Mass Index (BMI) 40.0 and over, adult: Secondary | ICD-10-CM | POA: Diagnosis not present

## 2016-06-04 DIAGNOSIS — IMO0001 Reserved for inherently not codable concepts without codable children: Secondary | ICD-10-CM

## 2016-06-04 NOTE — Patient Instructions (Addendum)
   Increase your food intake earlier in the day to balance calories throughout the day. Try a peanut butter sandwich for lunch.   Control portions of starches at supper, keep to 1/2-2/3 cup or less (after cooking).  Think of ways to add some physical activity into the day, at least by increasing steps you take during the day.   Use phone app to start tracking food intake; aim for 1200 calories daily.

## 2016-06-04 NOTE — Progress Notes (Signed)
Medical Nutrition Therapy: Visit start time: 1030  end time: 1130  Assessment:  Diagnosis: obesity Past medical history: none significant Psychosocial issues/ stress concerns: none, patient does report high stress level  Preferred learning method:  . Auditory   Current weight: 233.6lbs  Height: 5'2" Medications, supplements: none taken at this time Progress and evaluation: Patient reports steady weight for past 2 years, no diets in that time.  Her goal weight is 175lbs. She has been working to increase intake of vegetables as well as water.   Physical activity: none  Dietary Intake:  Usual eating pattern includes 1 meals and 1-2 snacks per day. Dining out frequency: 3 meals per week.  Breakfast: none Snack: 11-12 maybe chips or crackers, coffee Lunch: none Snack: none Supper: spaghetti, chicken baked rotisserie or fried, steak cubed with gravy, mashed potatoes, baked, sweet, veg: broccoli, asparagus Snack: sometimes ice cream and cookies, not regulalry Beverages: mostly water, 1c coffee in am  Nutrition Care Education: Topics covered: weight management Basic nutrition: basic food groups, appropriate nutrient balance, appropriate meal and snack schedule, general nutrition guidelines    Weight control: importance of eating at regular intervals and effects on metabolism; guidance for goal of 1200kcal daily intake, importance of portion control and low fat and low sugar food choices; benefits of daily intake tracking, regular weighing, and regular exercise. Practiced planning balanced meals using food models. Advised gradually increasing kcal intake earlier in the day.    Nutritional Diagnosis:  [place]-3.3 Overweight/obesity As related to erratic eating pattern, inactivity.  As evidenced by patient report.  Intervention: Instruction as noted above.   Set goals with patient input.    She and her family are all working on weight loss together. Advised 1500-1800kcal intake for men, and  1200-1500kcal intake for women for weight loss.    Patient deferred scheduling follow-up at this time; she will call and schedule later if needed.   Education Materials given:  . Food lists/ Planning A Balanced Meal . Sample meal pattern/ menus . Goals/ instructions  Learner/ who was taught:  . Patient  . Spouse/ partner  Level of understanding: Marland Kitchen Verbalizes/ demonstrates competency  Demonstrated degree of understanding via:   Teach back Learning barriers: . None  Willingness to learn/ readiness for change: . Eager, change in progress  Monitoring and Evaluation:  Dietary intake, exercise, and body weight      follow up: patient will schedule if and when needed.

## 2017-01-28 ENCOUNTER — Encounter: Payer: Self-pay | Admitting: Family Medicine

## 2017-01-28 ENCOUNTER — Ambulatory Visit (INDEPENDENT_AMBULATORY_CARE_PROVIDER_SITE_OTHER): Payer: 59 | Admitting: Family Medicine

## 2017-01-28 VITALS — BP 130/78 | HR 75 | Temp 98.0°F | Resp 14 | Ht 63.0 in | Wt 232.3 lb

## 2017-01-28 DIAGNOSIS — N898 Other specified noninflammatory disorders of vagina: Secondary | ICD-10-CM

## 2017-01-28 DIAGNOSIS — Z1211 Encounter for screening for malignant neoplasm of colon: Secondary | ICD-10-CM

## 2017-01-28 DIAGNOSIS — Z0000 Encounter for general adult medical examination without abnormal findings: Secondary | ICD-10-CM | POA: Diagnosis not present

## 2017-01-28 DIAGNOSIS — Z789 Other specified health status: Secondary | ICD-10-CM

## 2017-01-28 DIAGNOSIS — E66813 Obesity, class 3: Secondary | ICD-10-CM

## 2017-01-28 DIAGNOSIS — Z7289 Other problems related to lifestyle: Secondary | ICD-10-CM | POA: Insufficient documentation

## 2017-01-28 DIAGNOSIS — Z6841 Body Mass Index (BMI) 40.0 and over, adult: Secondary | ICD-10-CM | POA: Diagnosis not present

## 2017-01-28 DIAGNOSIS — R030 Elevated blood-pressure reading, without diagnosis of hypertension: Secondary | ICD-10-CM

## 2017-01-28 DIAGNOSIS — F1090 Alcohol use, unspecified, uncomplicated: Secondary | ICD-10-CM

## 2017-01-28 DIAGNOSIS — Z124 Encounter for screening for malignant neoplasm of cervix: Secondary | ICD-10-CM | POA: Diagnosis not present

## 2017-01-28 DIAGNOSIS — Z1239 Encounter for other screening for malignant neoplasm of breast: Secondary | ICD-10-CM

## 2017-01-28 NOTE — Patient Instructions (Addendum)
Try to limit alcohol to no more than 7 drinks per week Check out the information at familydoctor.org entitled "Nutrition for Weight Loss: What You Need to Know about Fad Diets" Try to lose between 1-2 pounds per week by taking in fewer calories and burning off more calories You can succeed by limiting portions, limiting foods dense in calories and fat, becoming more active, and drinking 8 glasses of water a day (64 ounces) Don't skip meals, especially breakfast, as skipping meals may alter your metabolism Do not use over-the-counter weight loss pills or gimmicks that claim rapid weight loss A healthy BMI (or body mass index) is between 18.5 and 24.9 You can calculate your ideal BMI at the [employer] website [employer]fr  Health Maintenance, Female Adopting a healthy lifestyle and getting preventive care can go a long way to promote health and wellness. Talk with your health care provider about what schedule of regular examinations is right for you. This is a good chance for you to check in with your provider about disease prevention and staying healthy. In between checkups, there are plenty of things you can do on your own. Experts have done a lot of research about which lifestyle changes and preventive measures are most likely to keep you healthy. Ask your health care provider for more information. Weight and diet Eat a healthy diet  Be sure to include plenty of vegetables, fruits, low-fat dairy products, and lean protein.  Do not eat a lot of foods high in solid fats, added sugars, or salt.  Get regular exercise. This is one of the most important things you can do for your health. ? Most adults should exercise for at least 150 minutes each week. The exercise should increase your heart rate and make you sweat (moderate-intensity exercise). ? Most adults should also do strengthening exercises at least twice a week. This is in addition to the  moderate-intensity exercise.  Maintain a healthy weight  Body mass index (BMI) is a measurement that can be used to identify possible weight problems. It estimates body fat based on height and weight. Your health care provider can help determine your BMI and help you achieve or maintain a healthy weight.  For females 74 years of age and older: ? A BMI below 18.5 is considered underweight. ? A BMI of 18.5 to 24.9 is normal. ? A BMI of 25 to 29.9 is considered overweight. ? A BMI of 30 and above is considered obese.  Watch levels of cholesterol and blood lipids  You should start having your blood tested for lipids and cholesterol at 47 years of age, then have this test every 5 years.  You may need to have your cholesterol levels checked more often if: ? Your lipid or cholesterol levels are high. ? You are older than 47 years of age. ? You are at high risk for heart disease.  Cancer screening Lung Cancer  Lung cancer screening is recommended for adults 56-29 years old who are at high risk for lung cancer because of a history of smoking.  A yearly low-dose CT scan of the lungs is recommended for people who: ? Currently smoke. ? Have quit within the past 15 years. ? Have at least a 30-pack-year history of smoking. A pack year is smoking an average of one pack of cigarettes a day for 1 year.  Yearly screening should continue until it has been 15 years since you quit.  Yearly screening should stop if you develop a health problem that  would prevent you from having lung cancer treatment.  Breast Cancer  Practice breast self-awareness. This means understanding how your breasts normally appear and feel.  It also means doing regular breast self-exams. Let your health care provider know about any changes, no matter how small.  If you are in your 20s or 30s, you should have a clinical breast exam (CBE) by a health care provider every 1-3 years as part of a regular health exam.  If you  are 54 or older, have a CBE every year. Also consider having a breast X-ray (mammogram) every year.  If you have a family history of breast cancer, talk to your health care provider about genetic screening.  If you are at high risk for breast cancer, talk to your health care provider about having an MRI and a mammogram every year.  Breast cancer gene (BRCA) assessment is recommended for women who have family members with BRCA-related cancers. BRCA-related cancers include: ? Breast. ? Ovarian. ? Tubal. ? Peritoneal cancers.  Results of the assessment will determine the need for genetic counseling and BRCA1 and BRCA2 testing.  Cervical Cancer Your health care provider may recommend that you be screened regularly for cancer of the pelvic organs (ovaries, uterus, and vagina). This screening involves a pelvic examination, including checking for microscopic changes to the surface of your cervix (Pap test). You may be encouraged to have this screening done every 3 years, beginning at age 13.  For women ages 48-65, health care providers may recommend pelvic exams and Pap testing every 3 years, or they may recommend the Pap and pelvic exam, combined with testing for human papilloma virus (HPV), every 5 years. Some types of HPV increase your risk of cervical cancer. Testing for HPV may also be done on women of any age with unclear Pap test results.  Other health care providers may not recommend any screening for nonpregnant women who are considered low risk for pelvic cancer and who do not have symptoms. Ask your health care provider if a screening pelvic exam is right for you.  If you have had past treatment for cervical cancer or a condition that could lead to cancer, you need Pap tests and screening for cancer for at least 20 years after your treatment. If Pap tests have been discontinued, your risk factors (such as having a new sexual partner) need to be reassessed to determine if screening should  resume. Some women have medical problems that increase the chance of getting cervical cancer. In these cases, your health care provider may recommend more frequent screening and Pap tests.  Colorectal Cancer  This type of cancer can be detected and often prevented.  Routine colorectal cancer screening usually begins at 47 years of age and continues through 47 years of age.  Your health care provider may recommend screening at an earlier age if you have risk factors for colon cancer.  Your health care provider may also recommend using home test kits to check for hidden blood in the stool.  A small camera at the end of a tube can be used to examine your colon directly (sigmoidoscopy or colonoscopy). This is done to check for the earliest forms of colorectal cancer.  Routine screening usually begins at age 37.  Direct examination of the colon should be repeated every 5-10 years through 47 years of age. However, you may need to be screened more often if early forms of precancerous polyps or small growths are found.  Skin Cancer  Check  your skin from head to toe regularly.  Tell your health care provider about any new moles or changes in moles, especially if there is a change in a mole's shape or color.  Also tell your health care provider if you have a mole that is larger than the size of a pencil eraser.  Always use sunscreen. Apply sunscreen liberally and repeatedly throughout the day.  Protect yourself by wearing long sleeves, pants, a wide-brimmed hat, and sunglasses whenever you are outside.  Heart disease, diabetes, and high blood pressure  High blood pressure causes heart disease and increases the risk of stroke. High blood pressure is more likely to develop in: ? People who have blood pressure in the high end of the normal range (130-139/85-89 mm Hg). ? People who are overweight or obese. ? People who are African American.  If you are 25-17 years of age, have your blood  pressure checked every 3-5 years. If you are 71 years of age or older, have your blood pressure checked every year. You should have your blood pressure measured twice-once when you are at a hospital or clinic, and once when you are not at a hospital or clinic. Record the average of the two measurements. To check your blood pressure when you are not at a hospital or clinic, you can use: ? An automated blood pressure machine at a pharmacy. ? A home blood pressure monitor.  If you are between 74 years and 89 years old, ask your health care provider if you should take aspirin to prevent strokes.  Have regular diabetes screenings. This involves taking a blood sample to check your fasting blood sugar level. ? If you are at a normal weight and have a low risk for diabetes, have this test once every three years after 47 years of age. ? If you are overweight and have a high risk for diabetes, consider being tested at a younger age or more often. Preventing infection Hepatitis B  If you have a higher risk for hepatitis B, you should be screened for this virus. You are considered at high risk for hepatitis B if: ? You were born in a country where hepatitis B is common. Ask your health care provider which countries are considered high risk. ? Your parents were born in a high-risk country, and you have not been immunized against hepatitis B (hepatitis B vaccine). ? You have HIV or AIDS. ? You use needles to inject street drugs. ? You live with someone who has hepatitis B. ? You have had sex with someone who has hepatitis B. ? You get hemodialysis treatment. ? You take certain medicines for conditions, including cancer, organ transplantation, and autoimmune conditions.  Hepatitis C  Blood testing is recommended for: ? Everyone born from [birth year] through 1966. ? Anyone with known risk factors for hepatitis C.  Sexually transmitted infections (STIs)  You should be screened for sexually transmitted  infections (STIs) including gonorrhea and chlamydia if: ? You are sexually active and are younger than 47 years of age. ? You are older than 47 years of age and your health care provider tells you that you are at risk for this type of infection. ? Your sexual activity has changed since you were last screened and you are at an increased risk for chlamydia or gonorrhea. Ask your health care provider if you are at risk.  If you do not have HIV, but are at risk, it may be recommended that you take a prescription medicine  daily to prevent HIV infection. This is called pre-exposure prophylaxis (PrEP). You are considered at risk if: ? You are sexually active and do not regularly use condoms or know the HIV status of your partner(s). ? You take drugs by injection. ? You are sexually active with a partner who has HIV.  Talk with your health care provider about whether you are at high risk of being infected with HIV. If you choose to begin PrEP, you should first be tested for HIV. You should then be tested every 3 months for as long as you are taking PrEP. Pregnancy  If you are premenopausal and you may become pregnant, ask your health care provider about preconception counseling.  If you may become pregnant, take 400 to 800 micrograms (mcg) of folic acid every day.  If you want to prevent pregnancy, talk to your health care provider about birth control (contraception). Osteoporosis and menopause  Osteoporosis is a disease in which the bones lose minerals and strength with aging. This can result in serious bone fractures. Your risk for osteoporosis can be identified using a bone density scan.  If you are 23 years of age or older, or if you are at risk for osteoporosis and fractures, ask your health care provider if you should be screened.  Ask your health care provider whether you should take a calcium or vitamin D supplement to lower your risk for osteoporosis.  Menopause may have certain physical  symptoms and risks.  Hormone replacement therapy may reduce some of these symptoms and risks. Talk to your health care provider about whether hormone replacement therapy is right for you. Follow these instructions at home:  Schedule regular health, dental, and eye exams.  Stay current with your immunizations.  Do not use any tobacco products including cigarettes, chewing tobacco, or electronic cigarettes.  If you are pregnant, do not drink alcohol.  If you are breastfeeding, limit how much and how often you drink alcohol.  Limit alcohol intake to no more than 1 drink per day for nonpregnant women. One drink equals 12 ounces of beer, 5 ounces of wine, or 1 ounces of hard liquor.  Do not use street drugs.  Do not share needles.  Ask your health care provider for help if you need support or information about quitting drugs.  Tell your health care provider if you often feel depressed.  Tell your health care provider if you have ever been abused or do not feel safe at home. This information is not intended to replace advice given to you by your health care provider. Make sure you discuss any questions you have with your health care provider. Document Released: 01/06/2011 Document Revised: 11/29/2015 Document Reviewed: 03/27/2015 Elsevier Interactive Patient Education  [employer].

## 2017-01-28 NOTE — Assessment & Plan Note (Signed)
Yearly mammograms; CBE today

## 2017-01-28 NOTE — Assessment & Plan Note (Signed)
Advised patient of safe alcohol limits for women, encouraged her to cut back and notify me if she had difficulty; call me because I'm here to help; also discussed the amount of "empty" calories in alcohol, more per gram than in carbs, and that cutting back should also help her lose weight

## 2017-01-28 NOTE — Assessment & Plan Note (Signed)
UTD

## 2017-01-28 NOTE — Assessment & Plan Note (Signed)
Encouraged weight loss; advised more water, watching portions, especially in relation to how her husband eats; offered referral to bariatric specialist; she declined; does not want surgery

## 2017-01-28 NOTE — Assessment & Plan Note (Signed)
USPSTF grade A and B recommendations reviewed with patient; age-appropriate recommendations, preventive care, screening tests, etc discussed and encouraged; healthy living encouraged; see AVS for patient education given to patient  

## 2017-01-28 NOTE — Assessment & Plan Note (Signed)
Check today, every 3-5 years

## 2017-01-28 NOTE — Progress Notes (Signed)
Patient ID: Judy Robinson, female   DOB: Dec 11, 1970, 46 y.o.   MRN: [id number]   Subjective:   Judy Robinson is a 46 y.o. female here for a complete physical exam  Interim issues since last visit: She met with nutritionist last fall for her obesity; has not lost any significant weight since last visit  USPSTF grade A and B recommendations Depression:  Depression screen [facility] 2/9 01/28/2017 06/04/2016 01/30/2016 02/09/2015  Decreased Interest 0 0 0 0  Down, Depressed, Hopeless 0 0 0 0  PHQ - 2 Score 0 0 0 0   Hypertension: chicken pie with cream of mushroom soup BP Readings from Last 3 Encounters:  01/28/17 130/78  03/25/16 (!) 115/58  01/30/16 122/84   Obesity: she can't seem it anywhere; met with nutritionist; she does not want to have bariatric surgery Wt Readings from Last 3 Encounters:  01/28/17 232 lb 4.8 oz (105.4 kg)  06/04/16 233 lb 9.6 oz (106 kg)  03/25/16 230 lb (104.3 kg)   BMI Readings from Last 3 Encounters:  01/28/17 41.15 kg/m  06/04/16 42.73 kg/m  03/25/16 42.07 kg/m    Alcohol: yes, 1-2 glasses of wine per day Tobacco use: no HIV, hep B, hep C: declined today STD testing and prevention (chl/gon/syphilis): declined today Intimate partner violence: no abuse Breast cancer: no lumps in the breast BRCA gene screening: no breast or ovarian cancer in the family Cervical cancer screening: today; no previous abnormal pap smears Osteoporosis: n/a Fall prevention/vitamin D: no supplements; walks every day, 30 minutes Lipids:  Lab Results  Component Value Date   CHOL 190 01/30/2016   CHOL 183 02/09/2015   Lab Results  Component Value Date   HDL 41 01/30/2016   HDL 42 02/09/2015   Lab Results  Component Value Date   LDLCALC 123 (H) 01/30/2016   LDLCALC 121 (H) 02/09/2015   Lab Results  Component Value Date   TRIG 130 01/30/2016   TRIG 99 02/09/2015   Lab Results  Component Value Date   CHOLHDL 4.6 (H) 01/30/2016   CHOLHDL 4.4  02/09/2015   No results found for: LDLDIRECT Glucose:  Glucose  Date Value Ref Range Status  01/30/2016 103 (H) 65 - 99 mg/dL Final    Comment:    Specimen received in contact with cells. No visible hemolysis present. However GLUC may be decreased and K increased. Clinical correlation indicated.   02/09/2015 85 65 - 99 mg/dL Final   Colorectal cancer: had colonoscopy, next due 3 years later in 2020 with Dr. Allen Norris Lung cancer:  n/a AAA: n/a Aspirin: n/a Diet: she cut out sugar/coffee; no idea about calorie counting Exercise: walking 30 minutes a day five days a week Skin cancer: nothing worrisome  Past Medical History:  Diagnosis Date  . Obesity, Class III, BMI 40-49.9 (morbid obesity) ([place]) 02/09/2015   Past Surgical History:  Procedure Laterality Date  . CESAREAN SECTION    . COLONOSCOPY WITH PROPOFOL N/A 03/25/2016   Procedure: COLONOSCOPY WITH PROPOFOL;  Surgeon: Lucilla Lame, MD;  Location: [facility] ENDOSCOPY;  Service: Endoscopy;  Laterality: N/A;  . TUBAL LIGATION     Family History  Problem Relation Age of Onset  . Hypertension Mother   . Thyroid disease Mother   . Diabetes Brother   . Heart attack Brother   . Heart disease Father 35       heart attack  . Breast cancer Paternal Aunt   . Asthma Maternal Grandmother   . Cancer  Maternal Grandfather        prostate?  Marland Kitchen Heart attack Paternal Grandmother    Social History  Substance Use Topics  . Smoking status: Former Smoker    Packs/day: 0.25    Years: 1.00    Quit date: 01/30/2011  . Smokeless tobacco: Never Used  . Alcohol use 2.4 - 4.2 oz/week    4 - 7 Standard drinks or equivalent per week     Comment: ocassional   Review of Systems  Constitutional: Negative for fever.  HENT: Negative for hearing loss.   Eyes: Negative for visual disturbance.  Respiratory: Negative for shortness of breath.   Cardiovascular: Negative for chest pain.  Gastrointestinal: Negative for blood in stool.  Endocrine: Negative for  polydipsia.  Genitourinary: Negative for dyspareunia, dysuria, genital sores and hematuria.  Musculoskeletal:       Tendonitis in hands; across dorsum, middle 3 fingers; suggested ergonomic mouse  Skin:       No worrisome moles  Allergic/Immunologic: Negative for food allergies.  Neurological: Negative for tremors.  Hematological: Negative for adenopathy. Does not bruise/bleed easily.  Psychiatric/Behavioral: Negative for dysphoric mood.    Objective:   Vitals:   01/28/17 1006  BP: 130/78  Pulse: 75  Resp: 14  Temp: 98 F (36.7 C)  TempSrc: Oral  SpO2: 97%  Weight: 232 lb 4.8 oz (105.4 kg)  Height: '5\' 3"'$  (1.6 m)   Body mass index is 41.15 kg/m. Wt Readings from Last 3 Encounters:  01/28/17 232 lb 4.8 oz (105.4 kg)  06/04/16 233 lb 9.6 oz (106 kg)  03/25/16 230 lb (104.3 kg)   Physical Exam  Constitutional: She appears well-developed and well-nourished.  Morbidly obese  HENT:  Head: Normocephalic and atraumatic.  Eyes: Conjunctivae and EOM are normal. Right eye exhibits no hordeolum. Left eye exhibits no hordeolum. No scleral icterus.  Neck: Carotid bruit is not present. No thyromegaly present.  Cardiovascular: Normal rate, regular rhythm, S1 normal, S2 normal and normal heart sounds.   No extrasystoles are present.  Pulmonary/Chest: Effort normal and breath sounds normal. No respiratory distress. Right breast exhibits no inverted nipple, no mass, no nipple discharge, no skin change and no tenderness. Left breast exhibits no inverted nipple, no mass, no nipple discharge, no skin change and no tenderness. Breasts are symmetrical.  Abdominal: Soft. Normal appearance and bowel sounds are normal. She exhibits no distension, no abdominal bruit, no pulsatile midline mass and no mass. There is no hepatosplenomegaly. There is no tenderness. No hernia.  Genitourinary: Uterus normal. Pelvic exam was performed with patient prone. There is no rash or lesion on the right labia. There is  no rash or lesion on the left labia. Cervix exhibits no motion tenderness. Right adnexum displays no mass, no tenderness and no fullness. Left adnexum displays no mass, no tenderness and no fullness. Vaginal discharge (clear watery discharge) found.  Genitourinary Comments: Morbid obesity and excess adiopose tissue prevented visualization of the cervix, palpation of internal organs  Musculoskeletal: Normal range of motion. She exhibits no edema.  Lymphadenopathy:       Head (right side): No submandibular adenopathy present.       Head (left side): No submandibular adenopathy present.    She has no cervical adenopathy.    She has no axillary adenopathy.  Neurological: She is alert. She displays no tremor. No cranial nerve deficit. She exhibits normal muscle tone. Gait normal.  Skin: Skin is warm and dry. No bruising and no ecchymosis noted. No cyanosis.  No pallor.  Psychiatric: Her speech is normal and behavior is normal. Thought content normal. Her mood appears not anxious. She does not exhibit a depressed mood.    Assessment/Plan:   Problem List Items Addressed This Visit      Other   Special screening for malignant neoplasms, colon    UTD      Screening for cervical cancer    Check today, every 3-5 years      Relevant Orders   Pap IG and HPV (high risk) DNA detection   Preventative health care - Primary    USPSTF grade A and B recommendations reviewed with patient; age-appropriate recommendations, preventive care, screening tests, etc discussed and encouraged; healthy living encouraged; see AVS for patient education given to patient       Relevant Orders   Comprehensive metabolic panel   CBC with Differential/Platelet   Lipid panel   TSH   Hemoglobin A1c   Nicotine/cotinine metabolites   Obesity, Class III, BMI 40-49.9 (morbid obesity) ([place])    Encouraged weight loss; advised more water, watching portions, especially in relation to how her husband eats; offered referral to  bariatric specialist; she declined; does not want surgery      Breast cancer screening    Yearly mammograms; CBE today      Blood pressure elevated without history of HTN    Discussed goal BP; advised higher than normal (prehypertensive), try to limit sodium, work on weight loss      Alcohol use    Advised patient of safe alcohol limits for women, encouraged her to cut back and notify me if she had difficulty; call me because I'm here to help; also discussed the amount of "empty" calories in alcohol, more per gram than in carbs, and that cutting back should also help her lose weight       Other Visit Diagnoses    Screening for breast cancer       Relevant Orders   MM Digital Screening   Vaginal discharge       Relevant Orders   Wet prep, genital       No orders of the defined types were placed in this encounter.  Orders Placed This Encounter  Procedures  . Wet prep, genital  . MM Digital Screening    Standing Status:   Future    Standing Expiration Date:   03/31/2018    Order Specific Question:   Reason for Exam (SYMPTOM  OR DIAGNOSIS REQUIRED)    Answer:   screen for colon cancer    Order Specific Question:   Is the patient pregnant?    Answer:   No    Order Specific Question:   Preferred imaging location?    Answer:   [facility]  . Comprehensive metabolic panel  . CBC with Differential/Platelet  . Lipid panel  . TSH  . Hemoglobin A1c  . Nicotine/cotinine metabolites    Follow up plan: Return in about 1 year (around 01/28/2018) for complete physical.  An After Visit Summary was printed and given to the patient.

## 2017-01-28 NOTE — Assessment & Plan Note (Signed)
Discussed goal BP; advised higher than normal (prehypertensive), try to limit sodium, work on weight loss

## 2017-01-30 ENCOUNTER — Other Ambulatory Visit: Payer: Self-pay | Admitting: Family Medicine

## 2017-01-30 LAB — PAP IG AND HPV HIGH-RISK
HPV, high-risk: NEGATIVE
PAP Smear Comment: 0

## 2017-01-30 MED ORDER — CLINDAMYCIN PHOSPHATE 2 % VA CREA: 1.0000 | TOPICAL_CREAM | Freq: Every day | VAGINAL | 0 refills | Status: AC

## 2017-01-30 NOTE — Progress Notes (Signed)
clinda instead of flagyl since she drinks alcohol

## 2017-02-02 LAB — CBC WITH DIFFERENTIAL/PLATELET
Basophils Absolute: 0 10*3/uL (ref 0.0–0.2)
Basos: 1 %
EOS (ABSOLUTE): 0.2 10*3/uL (ref 0.0–0.4)
Eos: 3 %
Hematocrit: 39.5 % (ref 34.0–46.6)
Hemoglobin: 13.1 g/dL (ref 11.1–15.9)
Immature Grans (Abs): 0 10*3/uL (ref 0.0–0.1)
Immature Granulocytes: 0 %
Lymphocytes Absolute: 2.2 10*3/uL (ref 0.7–3.1)
Lymphs: 37 %
MCH: 29.8 pg (ref 26.6–33.0)
MCHC: 33.2 g/dL (ref 31.5–35.7)
MCV: 90 fL (ref 79–97)
Monocytes Absolute: 0.5 10*3/uL (ref 0.1–0.9)
Monocytes: 8 %
Neutrophils Absolute: 3 10*3/uL (ref 1.4–7.0)
Neutrophils: 51 %
Platelets: 233 10*3/uL (ref 150–379)
RBC: 4.4 x10E6/uL (ref 3.77–5.28)
RDW: 13.7 % (ref 12.3–15.4)
WBC: 5.9 10*3/uL (ref 3.4–10.8)

## 2017-02-02 LAB — COMPREHENSIVE METABOLIC PANEL
ALT: 14 IU/L (ref 0–32)
AST: 16 IU/L (ref 0–40)
Albumin/Globulin Ratio: 1.3 (ref 1.2–2.2)
Albumin: 4.4 g/dL (ref 3.5–5.5)
Alkaline Phosphatase: 71 IU/L (ref 39–117)
BUN/Creatinine Ratio: 12 (ref 9–23)
BUN: 10 mg/dL (ref 6–24)
Bilirubin Total: 0.4 mg/dL (ref 0.0–1.2)
CO2: 24 mmol/L (ref 20–29)
Calcium: 9.4 mg/dL (ref 8.7–10.2)
Chloride: 103 mmol/L (ref 96–106)
Creatinine, Ser: 0.83 mg/dL (ref 0.57–1.00)
GFR calc Af Amer: 97 mL/min/{1.73_m2} (ref >59)
GFR calc non Af Amer: 84 mL/min/{1.73_m2} (ref >59)
Globulin, Total: 3.3 g/dL (ref 1.5–4.5)
Glucose: 96 mg/dL (ref 65–99)
Potassium: 4.8 mmol/L (ref 3.5–5.2)
Sodium: 140 mmol/L (ref 134–144)
Total Protein: 7.7 g/dL (ref 6.0–8.5)

## 2017-02-02 LAB — VAGINITIS/VAGINOSIS, DNA PROBE
Candida Species: NEGATIVE
Gardnerella vaginalis: POSITIVE — AB
Trichomonas vaginosis: NEGATIVE

## 2017-02-02 LAB — WET PREP, GENITAL

## 2017-02-02 LAB — HEMOGLOBIN A1C
Est. average glucose Bld gHb Est-mCnc: 108 mg/dL
Hgb A1c MFr Bld: 5.4 % (ref 4.8–5.6)

## 2017-02-02 LAB — LIPID PANEL
Chol/HDL Ratio: 4.2 ratio (ref 0.0–4.4)
Cholesterol, Total: 183 mg/dL (ref 100–199)
HDL: 44 mg/dL (ref >39)
LDL Calculated: 118 mg/dL — ABNORMAL HIGH (ref 0–99)
Triglycerides: 103 mg/dL (ref 0–149)
VLDL Cholesterol Cal: 21 mg/dL (ref 5–40)

## 2017-02-02 LAB — NICOTINE/COTININE METABOLITES
Cotinine: NOT DETECTED ng/mL
Nicotine: NOT DETECTED ng/mL

## 2017-02-02 LAB — TSH: TSH: 1.84 u[IU]/mL (ref 0.450–4.500)

## 2017-10-15 ENCOUNTER — Ambulatory Visit
Admission: RE | Admit: 2017-10-15 | Discharge: 2017-10-15 | Disposition: A | Discharge status: Home / Self Care | Payer: Managed Care, Other (non HMO) | Source: Ambulatory Visit | Attending: Family Medicine | Admitting: Family Medicine

## 2017-10-15 ENCOUNTER — Encounter: Payer: Self-pay | Admitting: Family Medicine

## 2017-10-15 ENCOUNTER — Ambulatory Visit: Payer: Self-pay

## 2017-10-15 ENCOUNTER — Ambulatory Visit: Payer: Managed Care, Other (non HMO) | Admitting: Family Medicine

## 2017-10-15 VITALS — BP 124/82 | HR 75 | Temp 99.2°F | Resp 14 | Ht 62.0 in | Wt 237.1 lb

## 2017-10-15 DIAGNOSIS — R05 Cough: Secondary | ICD-10-CM | POA: Insufficient documentation

## 2017-10-15 DIAGNOSIS — H6122 Impacted cerumen, left ear: Secondary | ICD-10-CM

## 2017-10-15 DIAGNOSIS — R059 Cough, unspecified: Secondary | ICD-10-CM

## 2017-10-15 MED ORDER — BENZONATATE 100 MG PO CAPS: 100.0000 mg | ORAL_CAPSULE | Freq: Three times a day (TID) | ORAL | 0 refills | Status: DC | PRN

## 2017-10-15 MED ORDER — ALBUTEROL SULFATE HFA 108 (90 BASE) MCG/ACT IN AERS: 2.0000 | INHALATION_SPRAY | RESPIRATORY_TRACT | 1 refills | Status: DC | PRN

## 2017-10-15 NOTE — Progress Notes (Signed)
BP 124/82   Pulse 75   Temp 99.2 F (37.3 C) (Oral)   Resp 14   Ht 5\' 2"  (1.575 m)   Wt 237 lb 1.6 oz (107.5 kg)   LMP 09/20/2017   SpO2 98%   BMI 43.37 kg/m    Subjective:    Patient ID: Judy Robinson, female    DOB: [birth year], 47 y.o.   MRN: [id number]  HPI: Judy Robinson is a 47 y.o. female  Chief Complaint  Patient presents with  . URI    onset 2 months with wheezing and cough    HPI Patient is here for an acute visit She has been wheezing and coughing for 2 months Around sick people at work No fevers Coughing up a little bit; soemtiems clear, sometimes a little whitish tint; just a little Some postnasal drainage No travel No weight loss or night sweats; no TB exposures Never been diagnosed with allergies except smoke Little bit of wheezing Grandmother had asthma No changes in environment Quit smoking 4-5 years ago No potted plants in bedroom  Obesity; she has already seen a dietician; she just needs to be more active; drinks 2-3 16 ounces of water a day  Depression screen [facility] 2/9 10/15/2017 01/28/2017 06/04/2016 01/30/2016 02/09/2015  Decreased Interest 0 0 0 0 0  Down, Depressed, Hopeless 0 0 0 0 0  PHQ - 2 Score 0 0 0 0 0    Relevant past medical, surgical, family and social history reviewed Past Medical History:  Diagnosis Date  . Obesity, Class III, BMI 40-49.9 (morbid obesity) ([place]) 02/09/2015   Past Surgical History:  Procedure Laterality Date  . CESAREAN SECTION    . COLONOSCOPY WITH PROPOFOL N/A 03/25/2016   Procedure: COLONOSCOPY WITH PROPOFOL;  Surgeon: Lucilla Lame, MD;  Location: [facility] ENDOSCOPY;  Service: Endoscopy;  Laterality: N/A;  . TUBAL LIGATION     Family History  Problem Relation Age of Onset  . Hypertension Mother   . Thyroid disease Mother   . Diabetes Brother   . Heart attack Brother   . Heart disease Father 16       heart attack  . Breast cancer Paternal Aunt   . Asthma Maternal Grandmother   . Cancer Maternal  Grandfather        prostate?  Marland Kitchen Heart attack Paternal Grandmother    Social History   Tobacco Use  . Smoking status: Former Smoker    Packs/day: 0.25    Years: 1.00    Pack years: 0.25    Last attempt to quit: 01/30/2011    Years since quitting: 6.7  . Smokeless tobacco: Never Used  Substance Use Topics  . Alcohol use: Yes    Alcohol/week: 2.4 - 4.2 oz    Types: 4 - 7 Standard drinks or equivalent per week    Comment: ocassional  . Drug use: No    Interim medical history since last visit reviewed. Allergies and medications reviewed  Review of Systems Per HPI unless specifically indicated above     Objective:    BP 124/82   Pulse 75   Temp 99.2 F (37.3 C) (Oral)   Resp 14   Ht 5\' 2"  (1.575 m)   Wt 237 lb 1.6 oz (107.5 kg)   LMP 09/20/2017   SpO2 98%   BMI 43.37 kg/m   Wt Readings from Last 3 Encounters:  10/15/17 237 lb 1.6 oz (107.5 kg)  01/28/17 232 lb 4.8 oz (105.4 kg)  06/04/16 233 lb 9.6 oz (106 kg)    Physical Exam  Constitutional: She appears well-developed and well-nourished.  Morbid obesity  HENT:  Right Ear: Tympanic membrane is not erythematous. No middle ear effusion.  Left Ear: Tympanic membrane is erythematous (along the superior aspect).  No middle ear effusion.  Mouth/Throat: Mucous membranes are normal.  Large clump of impacted cerumen, hard, pushed all the way against the TM on the LEFT; removed in toto with irrigation by CMA; recheck by MD showed intact membrane; relief, tolerated well  Eyes: EOM are normal. No scleral icterus.  Cardiovascular: Normal rate and regular rhythm.  Pulmonary/Chest: Effort normal and breath sounds normal. No stridor. No respiratory distress. She has no wheezes. She has no rales.  Skin: No erythema. No pallor.  Psychiatric: She has a normal mood and affect. Her behavior is normal.      Assessment & Plan:   Problem List Items Addressed This Visit      Other   Obesity, Class III, BMI 40-49.9 (morbid obesity)  ([place])    See AVS; patient will try to get a little more activity; I offered any other help such as dietician, medicine, referrals, and she will work on this on her own       Other Visit Diagnoses    Cough    -  Primary   discussed ddx; will get CXR, PFTs at the hospital; start tessalon perles, SABA PRN; weight loss encouraged; removed cerumen from left TM   Relevant Orders   DG Chest 2 View   Pulmonary function test   Impacted cerumen of left ear       removed in toto from left canal       Follow up plan: Return in about 3 weeks (around 11/05/2017).  An after-visit summary was printed and given to the patient at [place].  Please see the patient instructions which may contain other information and recommendations beyond what is mentioned above in the assessment and plan.  Meds ordered this encounter  Medications  . albuterol (PROVENTIL HFA;VENTOLIN HFA) 108 (90 Base) MCG/ACT inhaler    Sig: Inhale 2 puffs into the lungs every 4 (four) hours as needed for wheezing or shortness of breath.    Dispense:  1 Inhaler    Refill:  1  . benzonatate (TESSALON PERLES) 100 MG capsule    Sig: Take 1 capsule (100 mg total) by mouth every 8 (eight) hours as needed for cough.    Dispense:  30 capsule    Refill:  0    Orders Placed This Encounter  Procedures  . DG Chest 2 View  . Pulmonary function test

## 2017-10-15 NOTE — Assessment & Plan Note (Addendum)
See AVS; patient will try to get a little more activity; I offered any other help such as dietician, medicine, referrals, and she will work on this on her own

## 2017-10-15 NOTE — Telephone Encounter (Signed)
Pt. Called to report intermittent cough and wheeze over past 2 mos.  Stated the symptoms have worsened over past week.  Reported coughing up "clear to whitish" phlegm at times.  Stated she has some sinus drainage, but denies nasal congestion.  Denied fever, sore throat, shortness of breath, or chest pain/ tightness.  Reported she has tried allergy medication and Robitussin OTC, without any resolution of symptoms.  Requested an appt. For eval.  Appt. Given today at 11:20 AM.  Agrees with plan.      Reason for Disposition . Cough has been present for > 3 weeks  Answer Assessment - Initial Assessment Questions 1. ONSET: "When did the cough begin?"      About 2 month ago  2. SEVERITY: "How bad is the cough today?"      mild 3. RESPIRATORY DISTRESS: "Describe your breathing."     denies 4. FEVER: "Do you have a fever?" If so, ask: "What is your temperature, how was it measured, and when did it start?"     denies 5. SPUTUM: "Describe the color of your sputum" (clear, white, yellow, green)     "Clear-whitish" 6. HEMOPTYSIS: "Are you coughing up any blood?" If so ask: "How much?" (flecks, streaks, tablespoons, etc.)     Denied 7. CARDIAC HISTORY: "Do you have any history of heart disease?" (e.g., heart attack, congestive heart failure)      denies 8. LUNG HISTORY: "Do you have any history of lung disease?"  (e.g., pulmonary embolus, asthma, emphysema)     denies 9. PE RISK FACTORS: "Do you have a history of blood clots?" (or: recent major surgery, recent prolonged travel, bedridden )     No 10. OTHER SYMPTOMS: "Do you have any other symptoms?" (e.g., runny nose, wheezing, chest pain)       No chest pain, no shortness of breath, some intermittent sinus drainage 11. PREGNANCY: "Is there any chance you are pregnant?" "When was your last menstrual period?"       No; LMP 3/17 12. TRAVEL: "Have you traveled out of the country in the last month?" (e.g., travel history, exposures)       no  Protocols  used: [employer]

## 2017-10-15 NOTE — Telephone Encounter (Signed)
Just FYI.

## 2017-10-15 NOTE — Patient Instructions (Addendum)
Check out the information at familydoctor.org entitled "Nutrition for Weight Loss: What You Need to Know about Fad Diets" Try to lose between 1-2 pounds per week by taking in fewer calories and burning off more calories You can succeed by limiting portions, limiting foods dense in calories and fat, becoming more active, and drinking 8 glasses of water a day (64 ounces) Don't skip meals, especially breakfast, as skipping meals may alter your metabolism Do not use over-the-counter weight loss pills or gimmicks that claim rapid weight loss A healthy BMI (or body mass index) is between 18.5 and 24.9 You can calculate your ideal BMI at the [place] website [employer]fr Please have the chest xray done across the street Start on the tessalon perles for cough Use the inhaler if needed for tightness and wheezing   Obesity, Adult Obesity is the condition of having too much total body fat. Being overweight or obese means that your weight is greater than what is considered healthy for your body size. Obesity is determined by a measurement called BMI. BMI is an estimate of body fat and is calculated from height and weight. For adults, a BMI of 30 or higher is considered obese. Obesity can eventually lead to other health concerns and major illnesses, including:  Stroke.  Coronary artery disease (CAD).  Type 2 diabetes.  Some types of cancer, including cancers of the colon, breast, uterus, and gallbladder.  Osteoarthritis.  High blood pressure (hypertension).  High cholesterol.  Sleep apnea.  Gallbladder stones.  Infertility problems.  What are the causes? The main cause of obesity is taking in (consuming) more calories than your body uses for energy. Other factors that contribute to this condition may include:  Being born with genes that make you more likely to become obese.  Having a medical condition that causes obesity. These conditions  include: ? Hypothyroidism. ? Polycystic ovarian syndrome (PCOS). ? Binge-eating disorder. ? Cushing syndrome.  Taking certain medicines, such as steroids, antidepressants, and seizure medicines.  Not being physically active (sedentary lifestyle).  Living where there are limited places to exercise safely or buy healthy foods.  Not getting enough sleep.  What increases the risk? The following factors may increase your risk of this condition:  Having a family history of obesity.  Being a woman of African-American descent.  Being a man of Hispanic descent.  What are the signs or symptoms? Having excessive body fat is the main symptom of this condition. How is this diagnosed? This condition may be diagnosed based on:  Your symptoms.  Your medical history.  A physical exam. Your health care provider may measure: ? Your BMI. If you are an adult with a BMI between 25 and less than 30, you are considered overweight. If you are an adult with a BMI of 30 or higher, you are considered obese. ? The distances around your hips and your waist (circumferences). These may be compared to each other to help diagnose your condition. ? Your skinfold thickness. Your health care provider may gently pinch a fold of your skin and measure it.  How is this treated? Treatment for this condition often includes changing your lifestyle. Treatment may include some or all of the following:  Dietary changes. Work with your health care provider and a dietitian to set a weight-loss goal that is healthy and reasonable for you. Dietary changes may include eating: ? Smaller portions. A portion size is the amount of a particular food that is healthy for you to eat at one  time. This varies from person to person. ? Low-calorie or low-fat options. ? More whole grains, fruits, and vegetables.  Regular physical activity. This may include aerobic activity (cardio) and strength training.  Medicine to help you lose  weight. Your health care provider may prescribe medicine if you are unable to lose 1 pound a week after 6 weeks of eating more healthily and doing more physical activity.  Surgery. Surgical options may include gastric banding and gastric bypass. Surgery may be done if: ? Other treatments have not helped to improve your condition. ? You have a BMI of 40 or higher. ? You have life-threatening health problems related to obesity.  Follow these instructions at home:  Eating and drinking   Follow recommendations from your health care provider about what you eat and drink. Your health care provider may advise you to: ? Limit fast foods, sweets, and processed snack foods. ? Choose low-fat options, such as low-fat milk instead of whole milk. ? Eat 5 or more servings of fruits or vegetables every day. ? Eat at home more often. This gives you more control over what you eat. ? Choose healthy foods when you eat out. ? Learn what a healthy portion size is. ? Keep low-fat snacks on hand. ? Avoid sugary drinks, such as soda, fruit juice, iced tea sweetened with sugar, and flavored milk. ? Eat a healthy breakfast.  Drink enough water to keep your urine clear or pale yellow.  Do not go without eating for long periods of time (do not fast) or follow a fad diet. Fasting and fad diets can be unhealthy and even dangerous. Physical Activity  Exercise regularly, as told by your health care provider. Ask your health care provider what types of exercise are safe for you and how often you should exercise.  Warm up and stretch before being active.  Cool down and stretch after being active.  Rest between periods of activity. Lifestyle  Limit the time that you spend in front of your TV, computer, or video game system.  Find ways to reward yourself that do not involve food.  Limit alcohol intake to no more than 1 drink a day for nonpregnant women and 2 drinks a day for men. One drink equals 12 oz of beer,  5 oz of wine, or 1 oz of hard liquor. General instructions  Keep a weight loss journal to keep track of the food you eat and how much you exercise you get.  Take over-the-counter and prescription medicines only as told by your health care provider.  Take vitamins and supplements only as told by your health care provider.  Consider joining a support group. Your health care provider may be able to recommend a support group.  Keep all follow-up visits as told by your health care provider. This is important. Contact a health care provider if:  You are unable to meet your weight loss goal after 6 weeks of dietary and lifestyle changes. This information is not intended to replace advice given to you by your health care provider. Make sure you discuss any questions you have with your health care provider. Document Released: 07/31/2004 Document Revised: 11/26/2015 Document Reviewed: 04/11/2015 Elsevier Interactive Patient Education  2018 [employer].  Cough, Adult Coughing is a reflex that clears your throat and your airways. Coughing helps to heal and protect your lungs. It is normal to cough occasionally, but a cough that happens with other symptoms or lasts a long time may be a sign of  a condition that needs treatment. A cough may last only 2-3 weeks (acute), or it may last longer than 8 weeks (chronic). What are the causes? Coughing is commonly caused by:  Breathing in substances that irritate your lungs.  A viral or bacterial respiratory infection.  Allergies.  Asthma.  Postnasal drip.  Smoking.  Acid backing up from the stomach into the esophagus (gastroesophageal reflux).  Certain medicines.  Chronic lung problems, including COPD (or rarely, lung cancer).  Other medical conditions such as heart failure.  Follow these instructions at home: Pay attention to any changes in your symptoms. Take these actions to help with your discomfort:  Take medicines only as told by  your health care provider. ? If you were prescribed an antibiotic medicine, take it as told by your health care provider. Do not stop taking the antibiotic even if you start to feel better. ? Talk with your health care provider before you take a cough suppressant medicine.  Drink enough fluid to keep your urine clear or pale yellow.  If the air is dry, use a cold steam vaporizer or humidifier in your bedroom or your home to help loosen secretions.  Avoid anything that causes you to cough at work or at home.  If your cough is worse at night, try sleeping in a semi-upright position.  Avoid cigarette smoke. If you smoke, quit smoking. If you need help quitting, ask your health care provider.  Avoid caffeine.  Avoid alcohol.  Rest as needed.  Contact a health care provider if:  You have new symptoms.  You cough up pus.  Your cough does not get better after 2-3 weeks, or your cough gets worse.  You cannot control your cough with suppressant medicines and you are losing sleep.  You develop pain that is getting worse or pain that is not controlled with pain medicines.  You have a fever.  You have unexplained weight loss.  You have night sweats. Get help right away if:  You cough up blood.  You have difficulty breathing.  Your heartbeat is very fast. This information is not intended to replace advice given to you by your health care provider. Make sure you discuss any questions you have with your health care provider. Document Released: 12/20/2010 Document Revised: 11/29/2015 Document Reviewed: 08/30/2014 Elsevier Interactive Patient Education  [employer].

## 2017-10-16 ENCOUNTER — Telehealth: Payer: Self-pay

## 2017-10-16 NOTE — Telephone Encounter (Signed)
-----   Message from Arnetha Courser, MD sent at 10/15/2017  5:28 PM EDT ----- Guerry Minors, please let the patient know that her chest xray was negative. See if there is anything else we need to do to arrange for her to have the pulmonary function tests at the hospital. Thank you

## 2017-10-16 NOTE — Telephone Encounter (Signed)
Called pt no answer. LM for pt informing her of neg x-ray results. Pt to call back for questions or concerns. CRM created, results routed to [facility].

## 2017-11-27 ENCOUNTER — Encounter: Payer: Self-pay | Admitting: Family Medicine

## 2017-11-27 ENCOUNTER — Ambulatory Visit
Admission: RE | Admit: 2017-11-27 | Discharge: 2017-11-27 | Disposition: A | Discharge status: Home / Self Care | Payer: Managed Care, Other (non HMO) | Source: Ambulatory Visit | Attending: Family Medicine | Admitting: Family Medicine

## 2017-11-27 ENCOUNTER — Telehealth: Payer: Self-pay

## 2017-11-27 ENCOUNTER — Other Ambulatory Visit: Payer: Self-pay | Admitting: Family Medicine

## 2017-11-27 ENCOUNTER — Ambulatory Visit: Payer: Managed Care, Other (non HMO) | Admitting: Family Medicine

## 2017-11-27 VITALS — BP 122/86 | HR 78 | Temp 98.6°F | Resp 12 | Ht 62.0 in | Wt 236.6 lb

## 2017-11-27 DIAGNOSIS — R05 Cough: Secondary | ICD-10-CM | POA: Diagnosis not present

## 2017-11-27 DIAGNOSIS — Z7289 Other problems related to lifestyle: Secondary | ICD-10-CM

## 2017-11-27 DIAGNOSIS — Z1231 Encounter for screening mammogram for malignant neoplasm of breast: Secondary | ICD-10-CM | POA: Diagnosis not present

## 2017-11-27 DIAGNOSIS — Z1239 Encounter for other screening for malignant neoplasm of breast: Secondary | ICD-10-CM

## 2017-11-27 DIAGNOSIS — Z789 Other specified health status: Secondary | ICD-10-CM

## 2017-11-27 DIAGNOSIS — R059 Cough, unspecified: Secondary | ICD-10-CM

## 2017-11-27 MED ORDER — BUDESONIDE-FORMOTEROL FUMARATE 80-4.5 MCG/ACT IN AERO: 2.0000 | INHALATION_SPRAY | Freq: Two times a day (BID) | RESPIRATORY_TRACT | 12 refills | Status: DC

## 2017-11-27 NOTE — Progress Notes (Signed)
BP 122/86   Pulse 78   Temp 98.6 F (37 C) (Oral)   Resp 12   Ht 5\' 2"  (1.575 m)   Wt 236 lb 9.6 oz (107.3 kg)   LMP 11/16/2017   SpO2 98%   BMI 43.27 kg/m    Subjective:    Patient ID: Judy Robinson, female    DOB: [birth year], 47 y.o.   MRN: [id number]  HPI: Judy Robinson is a 47 y.o. female  Chief Complaint  Patient presents with  . Follow-up    HPI Patient is here for f/u Obesity; right now, she is walking every day; she is up to [facility] or 7k steps a day drinking more water; drinking 4 of the 16 ounce bottles of water Cut out coffee 1.5 months ago; she might have a Pepsi every now and then, once every two weeks She snacks a lot at work, bag of chips like that; doesn't eat breakfast; every now and then has lunch; has dinner every day Does not fry her food; prefers baked chicken; most stuff is baked; cooks hamburgers in the oven No medical excitement since last visit Normal periods currently; had tubal ligation Not used to eating so early Already seen nutritionist Has not taken any medicines in the past Not interested in bariatric surgery right now Drinking alcohol, 1-2 glasses a day, 4 a week Coughing still; for 3 months; people still show up sick to work No weight loss or night sweats; no TB exposures So cold at work; temperature changes are a trigger; laughter no much; no problems with exercise  Depression screen [facility] 2/9 11/27/2017 10/15/2017 01/28/2017 06/04/2016 01/30/2016  Decreased Interest 0 0 0 0 0  Down, Depressed, Hopeless 0 0 0 0 0  PHQ - 2 Score 0 0 0 0 0    Relevant past medical, surgical, family and social history reviewed Past Medical History:  Diagnosis Date  . Obesity, Class III, BMI 40-49.9 (morbid obesity) ([place]) 02/09/2015   Past Surgical History:  Procedure Laterality Date  . CESAREAN SECTION    . COLONOSCOPY WITH PROPOFOL N/A 03/25/2016   Procedure: COLONOSCOPY WITH PROPOFOL;  Surgeon: Lucilla Lame, MD;  Location: [facility] ENDOSCOPY;   Service: Endoscopy;  Laterality: N/A;  . TUBAL LIGATION     Family History  Problem Relation Age of Onset  . Hypertension Mother   . Thyroid disease Mother   . Diabetes Brother   . Heart attack Brother   . Heart disease Father 45       heart attack  . Breast cancer Paternal Aunt   . Asthma Maternal Grandmother   . Cancer Maternal Grandfather        prostate?  Marland Kitchen Heart attack Paternal Grandmother    Social History   Tobacco Use  . Smoking status: Former Smoker    Packs/day: 0.25    Years: 1.00    Pack years: 0.25    Last attempt to quit: 01/30/2011    Years since quitting: 6.8  . Smokeless tobacco: Never Used  Substance Use Topics  . Alcohol use: Yes    Alcohol/week: 2.4 - 4.2 oz    Types: 4 - 7 Standard drinks or equivalent per week    Comment: ocassional  . Drug use: No    Interim medical history since last visit reviewed. Allergies and medications reviewed  Review of Systems Per HPI unless specifically indicated above     Objective:    BP 122/86   Pulse 78  Temp 98.6 F (37 C) (Oral)   Resp 12   Ht 5\' 2"  (1.575 m)   Wt 236 lb 9.6 oz (107.3 kg)   LMP 11/16/2017   SpO2 98%   BMI 43.27 kg/m   Wt Readings from Last 3 Encounters:  11/27/17 236 lb 9.6 oz (107.3 kg)  10/15/17 237 lb 1.6 oz (107.5 kg)  01/28/17 232 lb 4.8 oz (105.4 kg)    Physical Exam  Constitutional: She appears well-developed and well-nourished.  Morbidly obese  HENT:  Mouth/Throat: Mucous membranes are normal.  Eyes: EOM are normal. No scleral icterus.  Cardiovascular: Normal rate and regular rhythm.  Pulmonary/Chest: Effort normal and breath sounds normal.  Psychiatric: She has a normal mood and affect. Her behavior is normal.      Assessment & Plan:   Problem List Items Addressed This Visit      Other   Obesity, Class III, BMI 40-49.9 (morbid obesity) ([place])    Encouragement given for weight loss; discussed activity level, hydration, smart foods choices, not skipping meals;  see AVS      Alcohol use    Advised empty calories, can contribute to weight gain; ideal limit for women is certainly no more than 7 drinks per week       Other Visit Diagnoses    Cough    -  Primary   will get the PFTs ordered earlier; staff checked on status; start Symbicort; to pulm if not improving   Relevant Orders   Pulmonary Function Test [facility] Only       Follow up plan: Return in about 3 weeks (around 12/18/2017) for follow-up visit with Dr. Sanda Klein.  An after-visit summary was printed and given to the patient at [place].  Please see the patient instructions which may contain other information and recommendations beyond what is mentioned above in the assessment and plan.  Meds ordered this encounter  Medications  . budesonide-formoterol (SYMBICORT) 80-4.5 MCG/ACT inhaler    Sig: Inhale 2 puffs into the lungs 2 (two) times daily.    Dispense:  1 Inhaler    Refill:  12    Orders Placed This Encounter  Procedures  . Pulmonary Function Test [facility] Only   Face-to-face time with patient was more than 25 minutes, >50% time spent counseling and coordination of care

## 2017-11-27 NOTE — Telephone Encounter (Signed)
Patient was informed that she has been scheduled for her PFT on 12/08/17 @ 2:30pm at [facility].

## 2017-11-27 NOTE — Patient Instructions (Addendum)
Consider MyFitnessPal or Fooducate to log calories  Increase walking to 7,000 to 8.000 steps a day Limit empty calories Let's get breathing studies Let's start the breathing medicine If you have not heard anything from my staff in a week about any orders/referrals/studies from today, please contact us here to follow-up [phone]

## 2017-12-01 NOTE — Assessment & Plan Note (Signed)
Advised empty calories, can contribute to weight gain; ideal limit for women is certainly no more than 7 drinks per week

## 2017-12-01 NOTE — Assessment & Plan Note (Signed)
Encouragement given for weight loss; discussed activity level, hydration, smart foods choices, not skipping meals; see AVS

## 2017-12-08 ENCOUNTER — Ambulatory Visit: Payer: Managed Care, Other (non HMO) | Attending: Family Medicine

## 2017-12-08 DIAGNOSIS — R059 Cough, unspecified: Secondary | ICD-10-CM

## 2017-12-08 DIAGNOSIS — R05 Cough: Secondary | ICD-10-CM | POA: Insufficient documentation

## 2017-12-08 MED ORDER — ALBUTEROL SULFATE (2.5 MG/3ML) 0.083% IN NEBU
2.5000 mg | INHALATION_SOLUTION | Freq: Once | RESPIRATORY_TRACT | Status: AC
  Administered 2017-12-08: 2.5 mg via RESPIRATORY_TRACT
  Filled 2017-12-08: qty 3

## 2017-12-21 ENCOUNTER — Ambulatory Visit: Payer: Managed Care, Other (non HMO) | Admitting: Family Medicine

## 2017-12-21 ENCOUNTER — Encounter: Payer: Self-pay | Admitting: Family Medicine

## 2017-12-21 DIAGNOSIS — R059 Cough, unspecified: Secondary | ICD-10-CM

## 2017-12-21 DIAGNOSIS — R05 Cough: Secondary | ICD-10-CM | POA: Diagnosis not present

## 2017-12-21 NOTE — Progress Notes (Signed)
BP 132/82   Pulse 72   Temp 98.5 F (36.9 C) (Oral)   Resp 14   Ht 5\' 2"  (1.575 m)   Wt 233 lb 8 oz (105.9 kg)   SpO2 95%   BMI 42.71 kg/m    Subjective:    Patient ID: Judy Robinson, female    DOB: 13-Jul-1970, 47 y.o.   MRN: [id number]  HPI: Judy Robinson is a 47 y.o. female  Chief Complaint  Patient presents with  . Follow-up    HPI Patient is here for f/u  She was here on 11/27/2017; coughing for months; she underwent PFTs; referred to Dr. Mortimer Fries but patient did not get that information She says the inhalers have helped; using the Symbicort; but just once a day; not twice a day; not coughing as much and just decided to use once a day; only uses the albuterol during the episodes but not more than 2x a week; no wheezing; no SHOB No rash; no bad allergy symptoms  Obesity; down 3+ pounds over the last 3+ weeks Walking more; drinking four bottles of H2O a day; no major changes to eating habits  Depression screen [facility] 2/9 11/27/2017 10/15/2017 01/28/2017 06/04/2016 01/30/2016  Decreased Interest 0 0 0 0 0  Down, Depressed, Hopeless 0 0 0 0 0  PHQ - 2 Score 0 0 0 0 0    Relevant past medical, surgical, family and social history reviewed Past Medical History:  Diagnosis Date  . Obesity, Class III, BMI 40-49.9 (morbid obesity) ([place]) 02/09/2015   Past Surgical History:  Procedure Laterality Date  . CESAREAN SECTION    . COLONOSCOPY WITH PROPOFOL N/A 03/25/2016   Procedure: COLONOSCOPY WITH PROPOFOL;  Surgeon: Lucilla Lame, MD;  Location: [facility] ENDOSCOPY;  Service: Endoscopy;  Laterality: N/A;  . TUBAL LIGATION     Family History  Problem Relation Age of Onset  . Hypertension Mother   . Thyroid disease Mother   . Diabetes Brother   . Heart attack Brother   . Heart disease Father 60       heart attack  . Breast cancer Paternal Aunt   . Asthma Maternal Grandmother   . Cancer Maternal Grandfather        prostate?  Marland Kitchen Heart attack Paternal Grandmother    Social  History   Tobacco Use  . Smoking status: Former Smoker    Packs/day: 0.25    Years: 1.00    Pack years: 0.25    Last attempt to quit: 01/30/2011    Years since quitting: 6.8  . Smokeless tobacco: Never Used  Substance Use Topics  . Alcohol use: Yes    Alcohol/week: 2.4 - 4.2 oz    Types: 4 - 7 Standard drinks or equivalent per week    Comment: ocassional  . Drug use: No    Interim medical history since last visit reviewed. Allergies and medications reviewed  Review of Systems Per HPI unless specifically indicated above     Objective:    BP 132/82   Pulse 72   Temp 98.5 F (36.9 C) (Oral)   Resp 14   Ht 5\' 2"  (1.575 m)   Wt 233 lb 8 oz (105.9 kg)   SpO2 95%   BMI 42.71 kg/m   Wt Readings from Last 3 Encounters:  12/21/17 233 lb 8 oz (105.9 kg)  11/27/17 236 lb 9.6 oz (107.3 kg)  10/15/17 237 lb 1.6 oz (107.5 kg)    Physical Exam  Constitutional: She appears well-developed and well-nourished.  HENT:  Mouth/Throat: Mucous membranes are normal.  Eyes: EOM are normal. No scleral icterus.  Cardiovascular: Normal rate and regular rhythm.  Pulmonary/Chest: Effort normal and breath sounds normal.  Psychiatric: She has a normal mood and affect. Her behavior is normal.      Assessment & Plan:   Problem List Items Addressed This Visit      Other   Obesity, Class III, BMI 40-49.9 (morbid obesity) ([place])    Praise given; encouragement given      Cough    Had PFTs done at the hospital; recommended to refer to pulmonologist, but she declined; will continue meds; see how she reacts next spring in case pollen was part of hte picture, but she does not describe allergic rhinitis symptoms; explained symbicort best if used BID          Follow up plan: No follow-ups on file.  An after-visit summary was printed and given to the patient at [place].  Please see the patient instructions which may contain other information and recommendations beyond what is mentioned above  in the assessment and plan.  No orders of the defined types were placed in this encounter.   No orders of the defined types were placed in this encounter.

## 2017-12-21 NOTE — Patient Instructions (Signed)
Return soon for physical / biometric screening Keep up the great job with steady weight loss Move more

## 2017-12-21 NOTE — Assessment & Plan Note (Addendum)
Had PFTs done at the hospital; recommended to refer to pulmonologist, but she declined; will continue meds; see how she reacts next spring in case pollen was part of hte picture, but she does not describe allergic rhinitis symptoms; explained symbicort best if used BID

## 2017-12-21 NOTE — Assessment & Plan Note (Signed)
Praise given; encouragement given

## 2018-02-03 ENCOUNTER — Ambulatory Visit (INDEPENDENT_AMBULATORY_CARE_PROVIDER_SITE_OTHER): Payer: Managed Care, Other (non HMO) | Admitting: Family Medicine

## 2018-02-03 ENCOUNTER — Encounter: Payer: Self-pay | Admitting: Family Medicine

## 2018-02-03 VITALS — BP 120/90 | HR 82 | Temp 98.5°F | Resp 16 | Ht 62.0 in | Wt 231.2 lb

## 2018-02-03 DIAGNOSIS — Z1211 Encounter for screening for malignant neoplasm of colon: Secondary | ICD-10-CM

## 2018-02-03 DIAGNOSIS — I10 Essential (primary) hypertension: Secondary | ICD-10-CM | POA: Insufficient documentation

## 2018-02-03 DIAGNOSIS — Z789 Other specified health status: Secondary | ICD-10-CM

## 2018-02-03 DIAGNOSIS — Z113 Encounter for screening for infections with a predominantly sexual mode of transmission: Secondary | ICD-10-CM

## 2018-02-03 DIAGNOSIS — Z7289 Other problems related to lifestyle: Secondary | ICD-10-CM

## 2018-02-03 DIAGNOSIS — Z0000 Encounter for general adult medical examination without abnormal findings: Secondary | ICD-10-CM

## 2018-02-03 DIAGNOSIS — Z1239 Encounter for other screening for malignant neoplasm of breast: Secondary | ICD-10-CM

## 2018-02-03 HISTORY — DX: Essential (primary) hypertension: I10

## 2018-02-03 NOTE — Assessment & Plan Note (Signed)
Safe limits in AVS

## 2018-02-03 NOTE — Patient Instructions (Addendum)
Next colonoscopy is September 2020  Check out the information at familydoctor.org entitled "Nutrition for Weight Loss: What You Need to Know about Fad Diets" Try to lose between 1-2 pounds per week by taking in fewer calories and burning off more calories You can succeed by limiting portions, limiting foods dense in calories and fat, becoming more active, and drinking 8 glasses of water a day (64 ounces) Don't skip meals, especially breakfast, as skipping meals may alter your metabolism Do not use over-the-counter weight loss pills or gimmicks that claim rapid weight loss A healthy BMI (or body mass index) is between 18.5 and 24.9 You can calculate your ideal BMI at the [employer] website ClubMon[employer].fr  Safe limits for alcohol intake for women are no more than 7 drinks total per week, and no more than 3 drinks in any one 24 hour period  Try to follow the DASH guidelines (DASH stands for Dietary Approaches to Stop Hypertension). Try to limit the sodium in your diet to no more than 1,549m of sodium per day. Certainly try to not exceed 2,000 mg per day at the very most. Do not add salt when cooking or at the table.  Check the sodium amount on labels when shopping, and choose items lower in sodium when given a choice. Avoid or limit foods that already contain a lot of sodium. Eat a diet rich in fruits and vegetables and whole grains, and try to lose weight if overweight or obese   Obesity, Adult Obesity is the condition of having too much total body fat. Being overweight or obese means that your weight is greater than what is considered healthy for your body size. Obesity is determined by a measurement called BMI. BMI is an estimate of body fat and is calculated from height and weight. For adults, a BMI of 30 or higher is considered obese. Obesity can eventually lead to other health concerns and major illnesses, including:  Stroke.  Coronary artery  disease (CAD).  Type 2 diabetes.  Some types of cancer, including cancers of the colon, breast, uterus, and gallbladder.  Osteoarthritis.  High blood pressure (hypertension).  High cholesterol.  Sleep apnea.  Gallbladder stones.  Infertility problems.  What are the causes? The main cause of obesity is taking in (consuming) more calories than your body uses for energy. Other factors that contribute to this condition may include:  Being born with genes that make you more likely to become obese.  Having a medical condition that causes obesity. These conditions include: ? Hypothyroidism. ? Polycystic ovarian syndrome (PCOS). ? Binge-eating disorder. ? Cushing syndrome.  Taking certain medicines, such as steroids, antidepressants, and seizure medicines.  Not being physically active (sedentary lifestyle).  Living where there are limited places to exercise safely or buy healthy foods.  Not getting enough sleep.  What increases the risk? The following factors may increase your risk of this condition:  Having a family history of obesity.  Being a woman of African-American descent.  Being a man of Hispanic descent.  What are the signs or symptoms? Having excessive body fat is the main symptom of this condition. How is this diagnosed? This condition may be diagnosed based on:  Your symptoms.  Your medical history.  A physical exam. Your health care provider may measure: ? Your BMI. If you are an adult with a BMI between 25 and less than 30, you are considered overweight. If you are an adult with a BMI of 30 or higher, you are considered obese. ?  The distances around your hips and your waist (circumferences). These may be compared to each other to help diagnose your condition. ? Your skinfold thickness. Your health care provider may gently pinch a fold of your skin and measure it.  How is this treated? Treatment for this condition often includes changing your  lifestyle. Treatment may include some or all of the following:  Dietary changes. Work with your health care provider and a dietitian to set a weight-loss goal that is healthy and reasonable for you. Dietary changes may include eating: ? Smaller portions. A portion size is the amount of a particular food that is healthy for you to eat at one time. This varies from person to person. ? Low-calorie or low-fat options. ? More whole grains, fruits, and vegetables.  Regular physical activity. This may include aerobic activity (cardio) and strength training.  Medicine to help you lose weight. Your health care provider may prescribe medicine if you are unable to lose 1 pound a week after 6 weeks of eating more healthily and doing more physical activity.  Surgery. Surgical options may include gastric banding and gastric bypass. Surgery may be done if: ? Other treatments have not helped to improve your condition. ? You have a BMI of 40 or higher. ? You have life-threatening health problems related to obesity.  Follow these instructions at home:  Eating and drinking   Follow recommendations from your health care provider about what you eat and drink. Your health care provider may advise you to: ? Limit fast foods, sweets, and processed snack foods. ? Choose low-fat options, such as low-fat milk instead of whole milk. ? Eat 5 or more servings of fruits or vegetables every day. ? Eat at home more often. This gives you more control over what you eat. ? Choose healthy foods when you eat out. ? Learn what a healthy portion size is. ? Keep low-fat snacks on hand. ? Avoid sugary drinks, such as soda, fruit juice, iced tea sweetened with sugar, and flavored milk. ? Eat a healthy breakfast.  Drink enough water to keep your urine clear or pale yellow.  Do not go without eating for long periods of time (do not fast) or follow a fad diet. Fasting and fad diets can be unhealthy and even dangerous. Physical  Activity  Exercise regularly, as told by your health care provider. Ask your health care provider what types of exercise are safe for you and how often you should exercise.  Warm up and stretch before being active.  Cool down and stretch after being active.  Rest between periods of activity. Lifestyle  Limit the time that you spend in front of your TV, computer, or video game system.  Find ways to reward yourself that do not involve food.  Limit alcohol intake to no more than 1 drink a day for nonpregnant women and 2 drinks a day for men. One drink equals 12 oz of beer, 5 oz of wine, or 1 oz of hard liquor. General instructions  Keep a weight loss journal to keep track of the food you eat and how much you exercise you get.  Take over-the-counter and prescription medicines only as told by your health care provider.  Take vitamins and supplements only as told by your health care provider.  Consider joining a support group. Your health care provider may be able to recommend a support group.  Keep all follow-up visits as told by your health care provider. This is important. Contact a  health care provider if:  You are unable to meet your weight loss goal after 6 weeks of dietary and lifestyle changes. This information is not intended to replace advice given to you by your health care provider. Make sure you discuss any questions you have with your health care provider. Document Released: 07/31/2004 Document Revised: 11/26/2015 Document Reviewed: 04/11/2015 Elsevier Interactive Patient Education  2018 [place].  Preventing Unhealthy [employer], Adult Staying at a healthy weight is important. When fat builds up in your body, you may become overweight or obese. These conditions put you at greater risk for developing certain health problems, such as heart disease, diabetes, sleeping problems, joint problems, and some cancers. Unhealthy weight gain is often the result of making  unhealthy choices in what you eat. It is also a result of not getting enough exercise. You can make changes to your lifestyle to prevent obesity and stay as healthy as possible. What nutrition changes can be made? To maintain a healthy weight and prevent obesity:  Eat only as much as your body needs. To do this: ? Pay attention to signs that you are hungry or full. Stop eating as soon as you feel full. ? If you feel hungry, try drinking water first. Drink enough water so your urine is clear or pale yellow. ? Eat smaller portions. ? Look at serving sizes on food labels. Most foods contain more than one serving per container. ? Eat the recommended amount of calories for your gender and activity level. While most active people should eat around 2,000 calories per day, if you are trying to lose weight or are not very active, you main need to eat less calories. Talk to your health care provider or dietitian about how many calories you should eat each day.  Choose healthy foods, such as: ? Fruits and vegetables. Try to fill at least half of your plate at each meal with fruits and vegetables. ? Whole grains, such as whole wheat bread, brown rice, and quinoa. ? Lean meats, such as chicken or fish. ? Other healthy proteins, such as beans, eggs, or tofu. ? Healthy fats, such as nuts, seeds, fatty fish, and olive oil. ? Low-fat or fat-free dairy.  Check food labels and avoid food and drinks that: ? Are high in calories. ? Have added sugar. ? Are high in sodium. ? Have saturated fats or trans fats.  Limit how much you eat of the following foods: ? Prepackaged meals. ? Fast food. ? Fried foods. ? Processed meat, such as bacon, sausage, and deli meats. ? Fatty cuts of red meat and poultry with skin.  Cook foods in healthier ways, such as by baking, broiling, or grilling.  When grocery shopping, try to shop around the outside of the store. This helps you buy mostly fresh foods and avoid canned and  prepackaged foods.  What lifestyle changes can be made?  Exercise at least 30 minutes 5 or more days each week. Exercising includes brisk walking, yard work, biking, running, swimming, and team sports like basketball and soccer. Ask your health care provider which exercises are safe for you.  Do not use any products that contain nicotine or tobacco, such as cigarettes and e-cigarettes. If you need help quitting, ask your health care provider.  Limit alcohol intake to no more than 1 drink a day for nonpregnant women and 2 drinks a day for men. One drink equals 12 oz of beer, 5 oz of wine, or 1 oz of hard liquor.  Try to get 7-9 hours of sleep each night. What other changes can be made?  Keep a food and activity journal to keep track of: ? What you ate and how many calories you had. Remember to count sauces, dressings, and side dishes. ? Whether you were active, and what exercises you did. ? Your calorie, weight, and activity goals.  Check your weight regularly. Track any changes. If you notice you have gained weight, make changes to your diet or activity routine.  Avoid taking weight-loss medicines or supplements. Talk to your health care provider before starting any new medicine or supplement.  Talk to your health care provider before trying any new diet or exercise plan. Why are these changes important? Eating healthy, staying active, and having healthy habits not only help prevent obesity, they also:  Help you to manage stress and emotions.  Help you to connect with friends and family.  Improve your self-esteem.  Improve your sleep.  Prevent long-term health problems.  What can happen if changes are not made? Being obese or overweight can cause you to develop joint or bone problems, which can make it hard for you to stay active or do activities you enjoy. Being obese or overweight also puts stress on your heart and lungs and can lead to health problems like diabetes, heart  disease, and some cancers. Where to find more information: Talk with your health care provider or a dietitian about healthy eating and healthy lifestyle choices. You may also find other information through these resources:  [place] Department of Agriculture MyPlate: FormerBoss.no  American Heart Association: www.heart.org  Centers for Disease Control and Prevention: http://www.wolf.info/  Summary  Staying at a healthy weight is important. It helps prevent certain diseases and health problems, such as heart disease, diabetes, joint problems, sleep disorders, and some cancers.  Being obese or overweight can cause you to develop joint or bone problems, which can make it hard for you to stay active or do activities you enjoy.  You can prevent unhealthy weight gain by eating a healthy diet, exercising regularly, not smoking, limiting alcohol, and getting enough sleep.  Talk with your health care provider or a dietitian for guidance about healthy eating and healthy lifestyle choices. This information is not intended to replace advice given to you by your health care provider. Make sure you discuss any questions you have with your health care provider. Document Released: 06/24/2016 Document Revised: 07/30/2016 Document Reviewed: 07/30/2016 Elsevier Interactive Patient Education  2018 [place] Maintenance, Female Adopting a healthy lifestyle and getting preventive care can go a long way to promote health and wellness. Talk with your health care provider about what schedule of regular examinations is right for you. This is a good chance for you to check in with your provider about disease prevention and staying healthy. In between checkups, there are plenty of things you can do on your own. Experts have done a lot of research about which lifestyle changes and preventive measures are most likely to keep you healthy. Ask your health care provider for more information. Weight and diet Eat a  healthy diet  Be sure to include plenty of vegetables, fruits, low-fat dairy products, and lean protein.  Do not eat a lot of foods high in solid fats, added sugars, or salt.  Get regular exercise. This is one of the most important things you can do for your health. ? Most adults should exercise for at least 150 minutes each week. The exercise should increase  your heart rate and make you sweat (moderate-intensity exercise). ? Most adults should also do strengthening exercises at least twice a week. This is in addition to the moderate-intensity exercise.  Maintain a healthy weight  Body mass index (BMI) is a measurement that can be used to identify possible weight problems. It estimates body fat based on height and weight. Your health care provider can help determine your BMI and help you achieve or maintain a healthy weight.  For females 63 years of age and older: ? A BMI below 18.5 is considered underweight. ? A BMI of 18.5 to 24.9 is normal. ? A BMI of 25 to 29.9 is considered overweight. ? A BMI of 30 and above is considered obese.  Watch levels of cholesterol and blood lipids  You should start having your blood tested for lipids and cholesterol at 48 years of age, then have this test every 5 years.  You may need to have your cholesterol levels checked more often if: ? Your lipid or cholesterol levels are high. ? You are older than 48 years of age. ? You are at high risk for heart disease.  Cancer screening Lung Cancer  Lung cancer screening is recommended for adults 17-54 years old who are at high risk for lung cancer because of a history of smoking.  A yearly low-dose CT scan of the lungs is recommended for people who: ? Currently smoke. ? Have quit within the past 15 years. ? Have at least a 30-pack-year history of smoking. A pack year is smoking an average of one pack of cigarettes a day for 1 year.  Yearly screening should continue until it has been 15 years since you  quit.  Yearly screening should stop if you develop a health problem that would prevent you from having lung cancer treatment.  Breast Cancer  Practice breast self-awareness. This means understanding how your breasts normally appear and feel.  It also means doing regular breast self-exams. Let your health care provider know about any changes, no matter how small.  If you are in your 20s or 30s, you should have a clinical breast exam (CBE) by a health care provider every 1-3 years as part of a regular health exam.  If you are 33 or older, have a CBE every year. Also consider having a breast X-ray (mammogram) every year.  If you have a family history of breast cancer, talk to your health care provider about genetic screening.  If you are at high risk for breast cancer, talk to your health care provider about having an MRI and a mammogram every year.  Breast cancer gene (BRCA) assessment is recommended for women who have family members with BRCA-related cancers. BRCA-related cancers include: ? Breast. ? Ovarian. ? Tubal. ? Peritoneal cancers.  Results of the assessment will determine the need for genetic counseling and BRCA1 and BRCA2 testing.  Cervical Cancer Your health care provider may recommend that you be screened regularly for cancer of the pelvic organs (ovaries, uterus, and vagina). This screening involves a pelvic examination, including checking for microscopic changes to the surface of your cervix (Pap test). You may be encouraged to have this screening done every 3 years, beginning at age 78.  For women ages 21-65, health care providers may recommend pelvic exams and Pap testing every 3 years, or they may recommend the Pap and pelvic exam, combined with testing for human papilloma virus (HPV), every 5 years. Some types of HPV increase your risk of cervical cancer.  Testing for HPV may also be done on women of any age with unclear Pap test results.  Other health care providers  may not recommend any screening for nonpregnant women who are considered low risk for pelvic cancer and who do not have symptoms. Ask your health care provider if a screening pelvic exam is right for you.  If you have had past treatment for cervical cancer or a condition that could lead to cancer, you need Pap tests and screening for cancer for at least 20 years after your treatment. If Pap tests have been discontinued, your risk factors (such as having a new sexual partner) need to be reassessed to determine if screening should resume. Some women have medical problems that increase the chance of getting cervical cancer. In these cases, your health care provider may recommend more frequent screening and Pap tests.  Colorectal Cancer  This type of cancer can be detected and often prevented.  Routine colorectal cancer screening usually begins at 48 years of age and continues through 48 years of age.  Your health care provider may recommend screening at an earlier age if you have risk factors for colon cancer.  Your health care provider may also recommend using home test kits to check for hidden blood in the stool.  A small camera at the end of a tube can be used to examine your colon directly (sigmoidoscopy or colonoscopy). This is done to check for the earliest forms of colorectal cancer.  Routine screening usually begins at age 34.  Direct examination of the colon should be repeated every 5-10 years through 48 years of age. However, you may need to be screened more often if early forms of precancerous polyps or small growths are found.  Skin Cancer  Check your skin from head to toe regularly.  Tell your health care provider about any new moles or changes in moles, especially if there is a change in a mole's shape or color.  Also tell your health care provider if you have a mole that is larger than the size of a pencil eraser.  Always use sunscreen. Apply sunscreen liberally and repeatedly  throughout the day.  Protect yourself by wearing long sleeves, pants, a wide-brimmed hat, and sunglasses whenever you are outside.  Heart disease, diabetes, and high blood pressure  High blood pressure causes heart disease and increases the risk of stroke. High blood pressure is more likely to develop in: ? People who have blood pressure in the high end of the normal range (130-139/85-89 mm Hg). ? People who are overweight or obese. ? People who are African American.  If you are 10-15 years of age, have your blood pressure checked every 3-5 years. If you are 32 years of age or older, have your blood pressure checked every year. You should have your blood pressure measured twice-once when you are at a hospital or clinic, and once when you are not at a hospital or clinic. Record the average of the two measurements. To check your blood pressure when you are not at a hospital or clinic, you can use: ? An automated blood pressure machine at a pharmacy. ? A home blood pressure monitor.  If you are between 85 years and 73 years old, ask your health care provider if you should take aspirin to prevent strokes.  Have regular diabetes screenings. This involves taking a blood sample to check your fasting blood sugar level. ? If you are at a normal weight and have a low  risk for diabetes, have this test once every three years after 48 years of age. ? If you are overweight and have a high risk for diabetes, consider being tested at a younger age or more often. Preventing infection Hepatitis B  If you have a higher risk for hepatitis B, you should be screened for this virus. You are considered at high risk for hepatitis B if: ? You were born in a country where hepatitis B is common. Ask your health care provider which countries are considered high risk. ? Your parents were born in a high-risk country, and you have not been immunized against hepatitis B (hepatitis B vaccine). ? You have HIV or AIDS. ? You  use needles to inject street drugs. ? You live with someone who has hepatitis B. ? You have had sex with someone who has hepatitis B. ? You get hemodialysis treatment. ? You take certain medicines for conditions, including cancer, organ transplantation, and autoimmune conditions.  Hepatitis C  Blood testing is recommended for: ? Everyone born from [birth year] through 1966. ? Anyone with known risk factors for hepatitis C.  Sexually transmitted infections (STIs)  You should be screened for sexually transmitted infections (STIs) including gonorrhea and chlamydia if: ? You are sexually active and are younger than 48 years of age. ? You are older than 48 years of age and your health care provider tells you that you are at risk for this type of infection. ? Your sexual activity has changed since you were last screened and you are at an increased risk for chlamydia or gonorrhea. Ask your health care provider if you are at risk.  If you do not have HIV, but are at risk, it may be recommended that you take a prescription medicine daily to prevent HIV infection. This is called pre-exposure prophylaxis (PrEP). You are considered at risk if: ? You are sexually active and do not regularly use condoms or know the HIV status of your partner(s). ? You take drugs by injection. ? You are sexually active with a partner who has HIV.  Talk with your health care provider about whether you are at high risk of being infected with HIV. If you choose to begin PrEP, you should first be tested for HIV. You should then be tested every 3 months for as long as you are taking PrEP. Pregnancy  If you are premenopausal and you may become pregnant, ask your health care provider about preconception counseling.  If you may become pregnant, take 400 to 800 micrograms (mcg) of folic acid every day.  If you want to prevent pregnancy, talk to your health care provider about birth control (contraception). Osteoporosis and  menopause  Osteoporosis is a disease in which the bones lose minerals and strength with aging. This can result in serious bone fractures. Your risk for osteoporosis can be identified using a bone density scan.  If you are 41 years of age or older, or if you are at risk for osteoporosis and fractures, ask your health care provider if you should be screened.  Ask your health care provider whether you should take a calcium or vitamin D supplement to lower your risk for osteoporosis.  Menopause may have certain physical symptoms and risks.  Hormone replacement therapy may reduce some of these symptoms and risks. Talk to your health care provider about whether hormone replacement therapy is right for you. Follow these instructions at home:  Schedule regular health, dental, and eye exams.  Stay current with  your immunizations.  Do not use any tobacco products including cigarettes, chewing tobacco, or electronic cigarettes.  If you are pregnant, do not drink alcohol.  If you are breastfeeding, limit how much and how often you drink alcohol.  Limit alcohol intake to no more than 1 drink per day for nonpregnant women. One drink equals 12 ounces of beer, 5 ounces of wine, or 1 ounces of hard liquor.  Do not use street drugs.  Do not share needles.  Ask your health care provider for help if you need support or information about quitting drugs.  Tell your health care provider if you often feel depressed.  Tell your health care provider if you have ever been abused or do not feel safe at home. This information is not intended to replace advice given to you by your health care provider. Make sure you discuss any questions you have with your health care provider. Document Released: 01/06/2011 Document Revised: 11/29/2015 Document Reviewed: 03/27/2015 Elsevier Interactive Patient Education  2018 [place] Eating Plan DASH stands for "Dietary Approaches to Stop Hypertension." The  DASH eating plan is a healthy eating plan that has been shown to reduce high blood pressure (hypertension). It may also reduce your risk for type 2 diabetes, heart disease, and stroke. The DASH eating plan may also help with weight loss. What are tips for following this plan? General guidelines  Avoid eating more than 2,300 mg (milligrams) of salt (sodium) a day. If you have hypertension, you may need to reduce your sodium intake to 1,500 mg a day.  Limit alcohol intake to no more than 1 drink a day for nonpregnant women and 2 drinks a day for men. One drink equals 12 oz of beer, 5 oz of wine, or 1 oz of hard liquor.  Work with your health care provider to maintain a healthy body weight or to lose weight. Ask what an ideal weight is for you.  Get at least 30 minutes of exercise that causes your heart to beat faster (aerobic exercise) most days of the week. Activities may include walking, swimming, or biking.  Work with your health care provider or diet and nutrition specialist (dietitian) to adjust your eating plan to your individual calorie needs. Reading food labels  Check food labels for the amount of sodium per serving. Choose foods with less than 5 percent of the Daily Value of sodium. Generally, foods with less than 300 mg of sodium per serving fit into this eating plan.  To find whole grains, look for the word "whole" as the first word in the ingredient list. Shopping  Buy products labeled as "low-sodium" or "no salt added."  Buy fresh foods. Avoid canned foods and premade or frozen meals. Cooking  Avoid adding salt when cooking. Use salt-free seasonings or herbs instead of table salt or sea salt. Check with your health care provider or pharmacist before using salt substitutes.  Do not fry foods. Cook foods using healthy methods such as baking, boiling, grilling, and broiling instead.  Cook with heart-healthy oils, such as olive, canola, soybean, or sunflower oil. Meal  planning   Eat a balanced diet that includes: ? 5 or more servings of fruits and vegetables each day. At each meal, try to fill half of your plate with fruits and vegetables. ? Up to 6-8 servings of whole grains each day. ? Less than 6 oz of lean meat, poultry, or fish each day. A 3-oz serving of meat is about the same  size as a deck of cards. One egg equals 1 oz. ? 2 servings of low-fat dairy each day. ? A serving of nuts, seeds, or beans 5 times each week. ? Heart-healthy fats. Healthy fats called Omega-3 fatty acids are found in foods such as flaxseeds and coldwater fish, like sardines, salmon, and mackerel.  Limit how much you eat of the following: ? Canned or prepackaged foods. ? Food that is high in trans fat, such as fried foods. ? Food that is high in saturated fat, such as fatty meat. ? Sweets, desserts, sugary drinks, and other foods with added sugar. ? Full-fat dairy products.  Do not salt foods before eating.  Try to eat at least 2 vegetarian meals each week.  Eat more home-cooked food and less restaurant, buffet, and fast food.  When eating at a restaurant, ask that your food be prepared with less salt or no salt, if possible. What foods are recommended? The items listed may not be a complete list. Talk with your dietitian about what dietary choices are best for you. Grains Whole-grain or whole-wheat bread. Whole-grain or whole-wheat pasta. Brown rice. [place]. Bulgur. Whole-grain and low-sodium cereals. Pita bread. Low-fat, low-sodium crackers. Whole-wheat flour tortillas. Vegetables Fresh or frozen vegetables (raw, steamed, roasted, or grilled). Low-sodium or reduced-sodium tomato and vegetable juice. Low-sodium or reduced-sodium tomato sauce and tomato paste. Low-sodium or reduced-sodium canned vegetables. Fruits All fresh, dried, or frozen fruit. Canned fruit in natural juice (without added sugar). Meat and other protein foods Skinless chicken or [place].  Ground chicken or [place]. Pork with fat trimmed off. Fish and seafood. Egg whites. Dried beans, peas, or lentils. Unsalted nuts, nut butters, and seeds. Unsalted canned beans. Lean cuts of beef with fat trimmed off. Low-sodium, lean deli meat. Dairy Low-fat (1%) or fat-free (skim) milk. Fat-free, low-fat, or reduced-fat cheeses. Nonfat, low-sodium ricotta or cottage cheese. Low-fat or nonfat yogurt. Low-fat, low-sodium cheese. Fats and oils Soft margarine without trans fats. Vegetable oil. Low-fat, reduced-fat, or light mayonnaise and salad dressings (reduced-sodium). Canola, safflower, olive, soybean, and sunflower oils. Avocado. Seasoning and other foods Herbs. Spices. Seasoning mixes without salt. Unsalted popcorn and pretzels. Fat-free sweets. What foods are not recommended? The items listed may not be a complete list. Talk with your dietitian about what dietary choices are best for you. Grains Baked goods made with fat, such as croissants, muffins, or some breads. Dry pasta or rice meal packs. Vegetables Creamed or fried vegetables. Vegetables in a cheese sauce. Regular canned vegetables (not low-sodium or reduced-sodium). Regular canned tomato sauce and paste (not low-sodium or reduced-sodium). Regular tomato and vegetable juice (not low-sodium or reduced-sodium). Angie Fava. Olives. Fruits Canned fruit in a light or heavy syrup. Fried fruit. Fruit in cream or butter sauce. Meat and other protein foods Fatty cuts of meat. Ribs. Fried meat. Berniece Salines. Sausage. Bologna and other processed lunch meats. Salami. Fatback. Hotdogs. Bratwurst. Salted nuts and seeds. Canned beans with added salt. Canned or smoked fish. Whole eggs or egg yolks. Chicken or [place] with skin. Dairy Whole or 2% milk, cream, and half-and-half. Whole or full-fat cream cheese. Whole-fat or sweetened yogurt. Full-fat cheese. Nondairy creamers. Whipped toppings. Processed cheese and cheese spreads. Fats and oils Butter. Stick  margarine. Lard. Shortening. Ghee. Bacon fat. Tropical oils, such as coconut, palm kernel, or palm oil. Seasoning and other foods Salted popcorn and pretzels. Onion salt, garlic salt, seasoned salt, table salt, and sea salt. Worcestershire sauce. Tartar sauce. Barbecue sauce. Teriyaki sauce. Soy sauce, including reduced-sodium.  Steak sauce. Canned and packaged gravies. Fish sauce. Oyster sauce. Cocktail sauce. Horseradish that you find on the shelf. Ketchup. Mustard. Meat flavorings and tenderizers. Bouillon cubes. Hot sauce and Tabasco sauce. Premade or packaged marinades. Premade or packaged taco seasonings. Relishes. Regular salad dressings. Where to find more information:  National Heart, Lung, and [place]: https://wilson-eaton.com/  American Heart Association: www.heart.org Summary  The DASH eating plan is a healthy eating plan that has been shown to reduce high blood pressure (hypertension). It may also reduce your risk for type 2 diabetes, heart disease, and stroke.  With the DASH eating plan, you should limit salt (sodium) intake to 2,300 mg a day. If you have hypertension, you may need to reduce your sodium intake to 1,500 mg a day.  When on the DASH eating plan, aim to eat more fresh fruits and vegetables, whole grains, lean proteins, low-fat dairy, and heart-healthy fats.  Work with your health care provider or diet and nutrition specialist (dietitian) to adjust your eating plan to your individual calorie needs. This information is not intended to replace advice given to you by your health care provider. Make sure you discuss any questions you have with your health care provider. Document Released: 06/12/2011 Document Revised: 06/16/2016 Document Reviewed: 06/16/2016 Elsevier Interactive Patient Education  [employer].

## 2018-02-03 NOTE — Addendum Note (Signed)
Addended by: LADA, Satira Anis on: 02/03/2018 09:44 AM   Modules accepted: Orders

## 2018-02-03 NOTE — Assessment & Plan Note (Signed)
USPSTF grade A and B recommendations reviewed with patient; age-appropriate recommendations, preventive care, screening tests, etc discussed and encouraged; healthy living encouraged; see AVS for patient education given to patient  

## 2018-02-03 NOTE — Assessment & Plan Note (Signed)
UTD; yearly mammograms

## 2018-02-03 NOTE — Progress Notes (Addendum)
BP 120/90 (BP Location: Right Arm, Patient Position: Sitting, Cuff Size: Normal)   Pulse 82   Temp 98.5 F (36.9 C) (Oral)   Resp 16   Ht 5' 2"  (1.575 m)   Wt 231 lb 3.2 oz (104.9 kg)   SpO2 99%   BMI 42.29 kg/m    Subjective:    Patient ID: Judy Robinson, female    DOB: Nov 15, 1970, 47 y.o.   MRN: [id number]  HPI: Judy Robinson is a 47 y.o. female  Chief Complaint  Patient presents with  . Annual Exam    HPI Patient is here for her annual physical  USPSTF grade A and B recommendations Depression:  Depression screen [facility] 2/9 02/03/2018 11/27/2017 10/15/2017 01/28/2017 06/04/2016  Decreased Interest 0 0 0 0 0  Down, Depressed, Hopeless 0 0 0 0 0  PHQ - 2 Score 0 0 0 0 0   Hypertension: discussed, meets criteria, DASH guidelines BP Readings from Last 3 Encounters:  02/03/18 120/90  12/21/17 132/82  11/27/17 122/86   Obesity: she is walking Wt Readings from Last 3 Encounters:  02/03/18 231 lb 3.2 oz (104.9 kg)  12/21/17 233 lb 8 oz (105.9 kg)  11/27/17 236 lb 9.6 oz (107.3 kg)   BMI Readings from Last 3 Encounters:  02/03/18 42.29 kg/m  12/21/17 42.71 kg/m  11/27/17 43.27 kg/m    Skin cancer: nothing worrisome Lung cancer:  Former smoker, not yet 89 Breast cancer: mammogram UTD Colorectal cancer: colonoscopy done 2017; next due 2020; serrated adenoma, checked path report, with Dr. Allen Norris Cervical cancer screening: done in 2018; next due 2021 BRCA gene screening: family hx of breast and/or ovarian cancer and/or metastatic prostate cancer? One aunt breast cancer; only relative HIV, hep B, hep C: sure STD testing and prevention (chl/gon/syphilis): sure Intimate partner violence: no abuse Contraception: tubes tied Osteoporosis: n/a Fall prevention/vitamin D: discussed Immunizations: tetanus UTD Diet: drinking enough water, 4 bottles a day; occasional partial sodas Exercise: she is walking, increased steps to 7k a day Alcohol: see audit Tobacco  use: former AAA: n/a Aspirin: n/a Glucose:  Glucose  Date Value Ref Range Status  01/28/2017 96 65 - 99 mg/dL Final  01/30/2016 103 (H) 65 - 99 mg/dL Final    Comment:    Specimen received in contact with cells. No visible hemolysis present. However GLUC may be decreased and K increased. Clinical correlation indicated.   02/09/2015 85 65 - 99 mg/dL Final   Lipids:  Lab Results  Component Value Date   CHOL 183 01/28/2017   CHOL 190 01/30/2016   CHOL 183 02/09/2015   Lab Results  Component Value Date   HDL 44 01/28/2017   HDL 41 01/30/2016   HDL 42 02/09/2015   Lab Results  Component Value Date   LDLCALC 118 (H) 01/28/2017   LDLCALC 123 (H) 01/30/2016   LDLCALC 121 (H) 02/09/2015   Lab Results  Component Value Date   TRIG 103 01/28/2017   TRIG 130 01/30/2016   TRIG 99 02/09/2015   Lab Results  Component Value Date   CHOLHDL 4.2 01/28/2017   CHOLHDL 4.6 (H) 01/30/2016   CHOLHDL 4.4 02/09/2015   No results found for: LDLDIRECT   Depression screen [facility] 2/9 02/03/2018 11/27/2017 10/15/2017 01/28/2017 06/04/2016  Decreased Interest 0 0 0 0 0  Down, Depressed, Hopeless 0 0 0 0 0  PHQ - 2 Score 0 0 0 0 0    Relevant past medical, surgical, family and social  history reviewed Past Medical History:  Diagnosis Date  . Obesity, Class III, BMI 40-49.9 (morbid obesity) ([place]) 02/09/2015   Past Surgical History:  Procedure Laterality Date  . CESAREAN SECTION    . COLONOSCOPY WITH PROPOFOL N/A 03/25/2016   Procedure: COLONOSCOPY WITH PROPOFOL;  Surgeon: Lucilla Lame, MD;  Location: [facility] ENDOSCOPY;  Service: Endoscopy;  Laterality: N/A;  . TUBAL LIGATION     Family History  Problem Relation Age of Onset  . Hypertension Mother   . Thyroid disease Mother   . Diabetes Brother   . Heart attack Brother   . Heart disease Father 70       heart attack  . Breast cancer Paternal Aunt   . Asthma Maternal Grandmother   . Cancer Maternal Grandfather        prostate?  Marland Kitchen Heart  attack Paternal Grandmother    Social History   Tobacco Use  . Smoking status: Former Smoker    Packs/day: 0.25    Years: 1.00    Pack years: 0.25    Last attempt to quit: 01/30/2011    Years since quitting: 7.0  . Smokeless tobacco: Never Used  Substance Use Topics  . Alcohol use: Yes    Alcohol/week: 2.4 - 4.2 oz    Types: 4 - 7 Standard drinks or equivalent per week    Comment: ocassional  . Drug use: No     [facility] Visit from [facility]/03/2018 in [facility]  AUDIT-C Score  4     Interim medical history since last visit reviewed. Allergies and medications reviewed  Review of Systems  Constitutional: Negative for chills and fever.  HENT: Negative for hearing loss.   Eyes: Negative for visual disturbance.  Gastrointestinal: Negative for blood in stool.  Endocrine: Negative for polydipsia.  Genitourinary: Negative for hematuria.  Musculoskeletal:       Tendinitis in hands  Skin:       No worrisome moles  Allergic/Immunologic: Negative for food allergies.  Neurological: Negative for tremors.  Hematological: Does not bruise/bleed easily.  Psychiatric/Behavioral: Negative for dysphoric mood.   Per HPI unless specifically indicated above     Objective:    BP 120/90 (BP Location: Right Arm, Patient Position: Sitting, Cuff Size: Normal)   Pulse 82   Temp 98.5 F (36.9 C) (Oral)   Resp 16   Ht 5' 2"  (1.575 m)   Wt 231 lb 3.2 oz (104.9 kg)   SpO2 99%   BMI 42.29 kg/m   Wt Readings from Last 3 Encounters:  02/03/18 231 lb 3.2 oz (104.9 kg)  12/21/17 233 lb 8 oz (105.9 kg)  11/27/17 236 lb 9.6 oz (107.3 kg)    Physical Exam  Constitutional: She appears well-developed and well-nourished.  HENT:  Head: Normocephalic and atraumatic.  Right Ear: Hearing, tympanic membrane, external ear and ear canal normal.  Left Ear: Hearing, tympanic membrane, external ear and ear canal normal.  Eyes: Conjunctivae and EOM are normal. Right eye exhibits no hordeolum.  Left eye exhibits no hordeolum. No scleral icterus.  Neck: Carotid bruit is not present. No thyromegaly present.  Cardiovascular: Normal rate, regular rhythm, S1 normal, S2 normal and normal heart sounds.  No extrasystoles are present.  Pulmonary/Chest: Effort normal and breath sounds normal. No respiratory distress. Right breast exhibits no inverted nipple, no mass, no nipple discharge, no skin change and no tenderness. Left breast exhibits no inverted nipple, no mass, no nipple discharge, no skin change and no tenderness. Breasts are symmetrical.  Abdominal: Soft. Normal appearance and bowel sounds are normal. She exhibits no distension, no abdominal bruit, no pulsatile midline mass and no mass. There is no hepatosplenomegaly. There is no tenderness. No hernia.  Musculoskeletal: Normal range of motion. She exhibits no edema.  Lymphadenopathy:       Head (right side): No submandibular adenopathy present.       Head (left side): No submandibular adenopathy present.    She has no cervical adenopathy.    She has no axillary adenopathy.  Neurological: She is alert. She displays no tremor. No cranial nerve deficit. She exhibits normal muscle tone. Gait normal.  Reflex Scores:      Patellar reflexes are 2+ on the right side and 2+ on the left side. Skin: Skin is warm and dry. No bruising and no ecchymosis noted. No cyanosis. No pallor.  Psychiatric: Her speech is normal and behavior is normal. Thought content normal. Her mood appears not anxious. She does not exhibit a depressed mood.   Results for orders placed or performed in visit on 01/28/17  Wet prep, genital  Result Value Ref Range   Trichomonas Exam CANCELED    Yeast Exam CANCELED    Clue Cell Exam CANCELED   Vaginitis/Vaginosis, DNA Probe  Result Value Ref Range   Candida Species Negative Negative   Gardnerella vaginalis Positive (A) Negative   Trichomonas vaginosis Negative Negative  Comprehensive metabolic panel  Result Value Ref  Range   Glucose 96 65 - 99 mg/dL   BUN 10 6 - 24 mg/dL   Creatinine, Ser 0.83 0.57 - 1.00 mg/dL   GFR calc non Af Amer 84 >59 mL/min/1.73   GFR calc Af Amer 97 >59 mL/min/1.73   BUN/Creatinine Ratio 12 9 - 23   Sodium 140 134 - 144 mmol/L   Potassium 4.8 3.5 - 5.2 mmol/L   Chloride 103 96 - 106 mmol/L   CO2 24 20 - 29 mmol/L   Calcium 9.4 8.7 - 10.2 mg/dL   Total Protein 7.7 6.0 - 8.5 g/dL   Albumin 4.4 3.5 - 5.5 g/dL   Globulin, Total 3.3 1.5 - 4.5 g/dL   Albumin/Globulin Ratio 1.3 1.2 - 2.2   Bilirubin Total 0.4 0.0 - 1.2 mg/dL   Alkaline Phosphatase 71 39 - 117 IU/L   AST 16 0 - 40 IU/L   ALT 14 0 - 32 IU/L  CBC with Differential/Platelet  Result Value Ref Range   WBC 5.9 3.4 - 10.8 x10E3/uL   RBC 4.40 3.77 - 5.28 x10E6/uL   Hemoglobin 13.1 11.1 - 15.9 g/dL   Hematocrit 39.5 34.0 - 46.6 %   MCV 90 79 - 97 fL   MCH 29.8 26.6 - 33.0 pg   MCHC 33.2 31.5 - 35.7 g/dL   RDW 13.7 12.3 - 15.4 %   Platelets 233 150 - 379 x10E3/uL   Neutrophils 51 Not Estab. %   Lymphs 37 Not Estab. %   Monocytes 8 Not Estab. %   Eos 3 Not Estab. %   Basos 1 Not Estab. %   Neutrophils Absolute 3.0 1.4 - 7.0 x10E3/uL   Lymphocytes Absolute 2.2 0.7 - 3.1 x10E3/uL   Monocytes Absolute 0.5 0.1 - 0.9 x10E3/uL   EOS (ABSOLUTE) 0.2 0.0 - 0.4 x10E3/uL   Basophils Absolute 0.0 0.0 - 0.2 x10E3/uL   Immature Granulocytes 0 Not Estab. %   Immature Grans (Abs) 0.0 0.0 - 0.1 x10E3/uL  Lipid panel  Result Value Ref Range   Cholesterol, Total 183  100 - 199 mg/dL   Triglycerides 103 0 - 149 mg/dL   HDL 44 >39 mg/dL   VLDL Cholesterol Cal 21 5 - 40 mg/dL   LDL Calculated 118 (H) 0 - 99 mg/dL   Chol/HDL Ratio 4.2 0.0 - 4.4 ratio  TSH  Result Value Ref Range   TSH 1.840 0.450 - 4.500 uIU/mL  Hemoglobin A1c  Result Value Ref Range   Hgb A1c MFr Bld 5.4 4.8 - 5.6 %   Est. average glucose Bld gHb Est-mCnc 108 mg/dL  Nicotine/cotinine metabolites  Result Value Ref Range   Nicotine None Detected ng/mL    Cotinine None Detected ng/mL  Pap IG and HPV (high risk) DNA detection  Result Value Ref Range   DIAGNOSIS: Comment    Specimen adequacy: Comment    Clinician Provided ICD10 Comment    Performed by: Comment    PAP Smear Comment .    Note: Comment    Test Methodology Comment    HPV, high-risk Negative Negative      Assessment & Plan:   Problem List Items Addressed This Visit      Cardiovascular and Mediastinum   Essential hypertension, benign    She has now had at least two readings in hypertensive range; diastolic 90 today; try DASH guidelines, weight loss; return in 4 weeks for f/u with NP        Other   Preventative health care - Primary    USPSTF grade A and B recommendations reviewed with patient; age-appropriate recommendations, preventive care, screening tests, etc discussed and encouraged; healthy living encouraged; see AVS for patient education given to patient       Relevant Orders   Comprehensive metabolic panel   CBC with Differential/Platelet   Hemoglobin A1c   Lipid panel   TSH   Obesity, Class III, BMI 40-49.9 (morbid obesity) ([place])    Encouraged weight loss; see AVS      Colon cancer screening    Next colonoscopy due 2020      Breast cancer screening    UTD; yearly mammograms      Alcohol use    Safe limits in AVS       Other Visit Diagnoses    Measles, mumps, rubella (MMR) vaccination status unknown       Relevant Orders   Measles/Mumps/Rubella Immunity   Screening for STD (sexually transmitted disease)       Relevant Orders   Hepatitis panel, acute   RPR   HIV antibody   Chlamydia trachomatis, DNA, amp probe       Follow up plan: Return in about 1 month (around 03/03/2018) for blood pressure, morbid obesity with NP.  An after-visit summary was printed and given to the patient at [facility].  Please see the patient instructions which may contain other information and recommendations beyond what is mentioned above in the assessment and  plan.  No orders of the defined types were placed in this encounter.   Orders Placed This Encounter  Procedures  . Chlamydia trachomatis, DNA, amp probe  . Measles/Mumps/Rubella Immunity  . Comprehensive metabolic panel  . CBC with Differential/Platelet  . Hemoglobin A1c  . Lipid panel  . TSH  . Hepatitis panel, acute  . RPR  . HIV antibody

## 2018-02-03 NOTE — Assessment & Plan Note (Signed)
Next colonoscopy due 2020

## 2018-02-03 NOTE — Assessment & Plan Note (Signed)
Encouraged weight loss; see AVS 

## 2018-02-03 NOTE — Assessment & Plan Note (Signed)
She has now had at least two readings in hypertensive range; diastolic 90 today; try DASH guidelines, weight loss; return in 4 weeks for f/u with NP

## 2018-02-04 ENCOUNTER — Encounter: Payer: Self-pay | Admitting: Family Medicine

## 2018-02-04 DIAGNOSIS — Z23 Encounter for immunization: Secondary | ICD-10-CM | POA: Insufficient documentation

## 2018-02-04 LAB — CBC WITH DIFFERENTIAL/PLATELET
Basophils Absolute: 0.1 10*3/uL (ref 0.0–0.2)
Basos: 1 %
EOS (ABSOLUTE): 0.5 10*3/uL — ABNORMAL HIGH (ref 0.0–0.4)
Eos: 8 %
Hematocrit: 37.4 % (ref 34.0–46.6)
Hemoglobin: 12.3 g/dL (ref 11.1–15.9)
Immature Grans (Abs): 0 10*3/uL (ref 0.0–0.1)
Immature Granulocytes: 1 %
Lymphocytes Absolute: 2 10*3/uL (ref 0.7–3.1)
Lymphs: 31 %
MCH: 29.1 pg (ref 26.6–33.0)
MCHC: 32.9 g/dL (ref 31.5–35.7)
MCV: 89 fL (ref 79–97)
Monocytes Absolute: 0.5 10*3/uL (ref 0.1–0.9)
Monocytes: 7 %
Neutrophils Absolute: 3.5 10*3/uL (ref 1.4–7.0)
Neutrophils: 52 %
Platelets: 247 10*3/uL (ref 150–450)
RBC: 4.22 x10E6/uL (ref 3.77–5.28)
RDW: 13.1 % (ref 12.3–15.4)
WBC: 6.6 10*3/uL (ref 3.4–10.8)

## 2018-02-04 LAB — RPR: RPR Ser Ql: NONREACTIVE

## 2018-02-04 LAB — COMPREHENSIVE METABOLIC PANEL
ALT: 14 IU/L (ref 0–32)
AST: 19 IU/L (ref 0–40)
Albumin/Globulin Ratio: 1.3 (ref 1.2–2.2)
Albumin: 4.3 g/dL (ref 3.5–5.5)
Alkaline Phosphatase: 77 IU/L (ref 39–117)
BUN/Creatinine Ratio: 13 (ref 9–23)
BUN: 11 mg/dL (ref 6–24)
Bilirubin Total: 0.3 mg/dL (ref 0.0–1.2)
CO2: 21 mmol/L (ref 20–29)
Calcium: 9.4 mg/dL (ref 8.7–10.2)
Chloride: 102 mmol/L (ref 96–106)
Creatinine, Ser: 0.88 mg/dL (ref 0.57–1.00)
GFR calc Af Amer: 90 mL/min/{1.73_m2} (ref >59)
GFR calc non Af Amer: 78 mL/min/{1.73_m2} (ref >59)
Globulin, Total: 3.3 g/dL (ref 1.5–4.5)
Glucose: 91 mg/dL (ref 65–99)
Potassium: 4.4 mmol/L (ref 3.5–5.2)
Sodium: 138 mmol/L (ref 134–144)
Total Protein: 7.6 g/dL (ref 6.0–8.5)

## 2018-02-04 LAB — MEASLES/MUMPS/RUBELLA IMMUNITY
MUMPS ABS, IGG: 15.7 AU/mL (ref >10.9)
RUBEOLA AB, IGG: <25 AU/mL — ABNORMAL LOW (ref >29.9)
Rubella Antibodies, IGG: 3.55 index (ref >0.99)

## 2018-02-04 LAB — LIPID PANEL
Chol/HDL Ratio: 4.1 ratio (ref 0.0–4.4)
Cholesterol, Total: 168 mg/dL (ref 100–199)
HDL: 41 mg/dL (ref >39)
LDL Calculated: 113 mg/dL — ABNORMAL HIGH (ref 0–99)
Triglycerides: 71 mg/dL (ref 0–149)
VLDL Cholesterol Cal: 14 mg/dL (ref 5–40)

## 2018-02-04 LAB — TSH: TSH: 1.1 u[IU]/mL (ref 0.450–4.500)

## 2018-02-04 LAB — HEMOGLOBIN A1C
Est. average glucose Bld gHb Est-mCnc: 105 mg/dL
Hgb A1c MFr Bld: 5.3 % (ref 4.8–5.6)

## 2018-02-04 LAB — HEPATITIS PANEL, ACUTE
Hep A IgM: NEGATIVE
Hep B C IgM: NEGATIVE
Hep C Virus Ab: 0.3 s/co ratio (ref 0.0–0.9)
Hepatitis B Surface Ag: NEGATIVE

## 2018-02-04 LAB — HIV ANTIBODY (ROUTINE TESTING W REFLEX): HIV Screen 4th Generation wRfx: NONREACTIVE

## 2018-03-09 ENCOUNTER — Encounter: Payer: Self-pay | Admitting: Nurse Practitioner

## 2018-03-09 ENCOUNTER — Ambulatory Visit: Payer: Managed Care, Other (non HMO) | Admitting: Nurse Practitioner

## 2018-03-09 VITALS — BP 134/86 | HR 60 | Temp 98.7°F | Resp 16 | Ht 62.0 in | Wt 234.5 lb

## 2018-03-09 DIAGNOSIS — R030 Elevated blood-pressure reading, without diagnosis of hypertension: Secondary | ICD-10-CM | POA: Diagnosis not present

## 2018-03-09 NOTE — Progress Notes (Signed)
Name: Judy Robinson   MRN: [id number]    DOB: May 01, 1971   Date:03/09/2018       Progress Note  Subjective  Chief Complaint  Chief Complaint  Patient presents with  . Hypertension  . Obesity    HPI  Patient presents to clinic for one month follow-up on blood pressure and weight.   Hypertension Patient has never been on blood pressure medications in the past. Discussed DASH guidelines and weight loss at physical last month. Has cut down on salt, already drinks plenty of water. Walks daily- walking about 7500 steps a day. More stress at work today.  No symptoms: denies headaches, chest pain, dizziness, vision changes.   Mostly eats once a day; dinner time around 7pm a meat, 2 vegetables and some times bread. Tries to make herself eat but its heard. Sometimes has chips and water for lunch.   BP Readings from Last 3 Encounters:  03/09/18 134/86  02/03/18 120/90  12/21/17 132/82   Wt Readings from Last 3 Encounters:  03/09/18 234 lb 8 oz (106.4 kg)  02/03/18 231 lb 3.2 oz (104.9 kg)  12/21/17 233 lb 8 oz (105.9 kg)       Patient Active Problem List   Diagnosis Date Noted  . Need for vaccination against measles 02/04/2018  . Essential hypertension, benign 02/03/2018  . Cough 12/21/2017  . Alcohol use 01/28/2017  . Benign neoplasm of sigmoid colon   . Preventative health care 01/30/2016  . Colon cancer screening 01/30/2016  . Blood pressure elevated without history of HTN 02/09/2015  . De Quervain's disease (radial styloid tenosynovitis) 02/09/2015  . Acne inversa 02/09/2015  . Obesity, Class III, BMI 40-49.9 (morbid obesity) ([place]) 02/09/2015  . Breast cancer screening 02/09/2015  . History of cigarette smoking 02/09/2015    Past Medical History:  Diagnosis Date  . Obesity, Class III, BMI 40-49.9 (morbid obesity) ([place]) 02/09/2015    Past Surgical History:  Procedure Laterality Date  . CESAREAN SECTION    . COLONOSCOPY WITH PROPOFOL N/A 03/25/2016   Procedure:  COLONOSCOPY WITH PROPOFOL;  Surgeon: Lucilla Lame, MD;  Location: [facility] ENDOSCOPY;  Service: Endoscopy;  Laterality: N/A;  . TUBAL LIGATION      Social History   Tobacco Use  . Smoking status: Former Smoker    Packs/day: 0.25    Years: 1.00    Pack years: 0.25    Last attempt to quit: 01/30/2011    Years since quitting: 7.1  . Smokeless tobacco: Never Used  Substance Use Topics  . Alcohol use: Yes    Alcohol/week: 4.0 - 7.0 standard drinks    Types: 4 - 7 Standard drinks or equivalent per week    Comment: ocassional     Current Outpatient Medications:  .  albuterol (PROVENTIL HFA;VENTOLIN HFA) 108 (90 Base) MCG/ACT inhaler, Inhale 2 puffs into the lungs every 4 (four) hours as needed for wheezing or shortness of breath., Disp: 1 Inhaler, Rfl: 1 .  budesonide-formoterol (SYMBICORT) 80-4.5 MCG/ACT inhaler, Inhale 2 puffs into the lungs 2 (two) times daily., Disp: 1 Inhaler, Rfl: 12  No Known Allergies  Review of Systems  Constitutional: Negative for chills and fever.  Eyes: Negative for blurred vision and double vision.  Respiratory: Negative for cough and shortness of breath.   Cardiovascular: Negative for chest pain and palpitations.  Gastrointestinal: Negative for abdominal pain, diarrhea, nausea and vomiting.  Musculoskeletal: Negative for falls and myalgias.  Skin: Negative for rash.  Neurological: Negative for dizziness, tingling  and headaches.  Psychiatric/Behavioral: Negative for depression. The patient is not nervous/anxious and does not have insomnia.      No other specific complaints in a complete review of systems (except as listed in HPI above).  Objective  Vitals:   03/09/18 1522  BP: 134/86  Pulse: 60  Resp: 16  Temp: 98.7 F (37.1 C)  TempSrc: Oral  SpO2: 99%  Weight: 234 lb 8 oz (106.4 kg)  Height: 5\' 2"  (1.575 m)    Body mass index is 42.89 kg/m.  Nursing Note and Vital Signs reviewed.  Physical Exam  Constitutional: She appears  well-developed and well-nourished. She is cooperative.  HENT:  Head: Normocephalic and atraumatic.  Right Ear: Hearing normal.  Left Ear: Hearing normal.  Mouth/Throat: Mucous membranes are normal.  Eyes: Conjunctivae are normal.  Cardiovascular: Normal rate, regular rhythm and normal heart sounds.  No lower extremity edema noted.  Pulmonary/Chest: Effort normal and breath sounds normal.  Abdominal: Soft. Normal appearance and bowel sounds are normal.  Neurological: She is alert.  Skin: Skin is warm and dry. No rash noted. No erythema.  Psychiatric: She has a normal mood and affect. Her speech is normal and behavior is normal. Judgment and thought content normal.      No results found for this or any previous visit (from the past 48 hour(s)).  Assessment & Plan 1. Elevated BP without diagnosis of hypertension - discussed DASH in detail - Our goal blood pressure is to be under 120/80. We typically treat with medicine if it is over 140/90 unless you have other concerning conditions.  - You can check your blood pressure at home if your feeling ill, or like once a week.   2. Obesity, Class III, BMI 40-49.9 (morbid obesity) ([place]) - Possibly bringing a few fruits and peanut butter to snack on during the day - Continue drinking plenty of water (at least 64 ounces a day)  - Walking goal of 8,000 steps a day (keep up with this and then when we are reaching goal 80% the time go up 500 steps)

## 2018-03-09 NOTE — Patient Instructions (Addendum)
-   Possibly bringing a few fruits and peanut butter to snack on during the day - Continue drinking plenty of water (at least 64 ounces a day)  - Walking goal of 8,000 steps a day (keep up with this and then when we are reaching goal 80% the time go up 500 steps) - Our goal blood pressure is to be under 120/80. We typically treat if it is over 140/90 - You can check your blood pressure at home if your feeling ill, or like once a week.

## 2018-04-14 ENCOUNTER — Encounter: Payer: Self-pay | Admitting: Family Medicine

## 2018-04-14 ENCOUNTER — Ambulatory Visit: Payer: Managed Care, Other (non HMO) | Admitting: Family Medicine

## 2018-04-14 VITALS — BP 136/82 | HR 81 | Temp 98.3°F | Ht 62.0 in | Wt 227.8 lb

## 2018-04-14 DIAGNOSIS — N920 Excessive and frequent menstruation with regular cycle: Secondary | ICD-10-CM | POA: Diagnosis not present

## 2018-04-14 DIAGNOSIS — N907 Vulvar cyst: Secondary | ICD-10-CM | POA: Diagnosis not present

## 2018-04-14 DIAGNOSIS — N898 Other specified noninflammatory disorders of vagina: Secondary | ICD-10-CM | POA: Diagnosis not present

## 2018-04-14 NOTE — Progress Notes (Signed)
BP 136/82   Pulse 81   Temp 98.3 F (36.8 C) (Oral)   Ht 5\' 2"  (1.575 m)   Wt 227 lb 12.8 oz (103.3 kg)   LMP 04/05/2018   SpO2 95%   BMI 41.67 kg/m    Subjective:    Patient ID: Judy Robinson, female    DOB: May 21, 1971, 47 y.o.   MRN: [id number]  HPI: Judy Robinson is a 47 y.o. female  Chief Complaint  Patient presents with  . Menstrual Problem    Very heavy and lasting for long periods of time    HPI  Patient is here for an acute visit Period started 11 days ago; first 4 days it was fine, then the next 2 days it was really heavy for about 2.5 days, now just a little spotting She is not sure when people in her family went through menopause No pelvic cramps; normally she is always in pain Tubes are tied; no pelvic pain Husband noticed a bump on the wall of the vagina a week before the period No burning with urination; no blood in the stool, no fevers Nothing similar before No OTC meds and no hormones  Previous period was the beginning of Sept and end of Sept; maybe 28 days in betweeen; early Sept period was normal No breast tenderness  Depression screen [facility] 2/9 04/14/2018 04/14/2018 02/03/2018 11/27/2017 10/15/2017  Decreased Interest 0 0 0 0 0  Down, Depressed, Hopeless 0 0 0 0 0  PHQ - 2 Score 0 0 0 0 0  Altered sleeping 0 - - - -  Tired, decreased energy 0 - - - -  Change in appetite 0 - - - -  Feeling bad or failure about yourself  0 - - - -  Trouble concentrating 0 - - - -  Moving slowly or fidgety/restless 0 - - - -  Suicidal thoughts 0 - - - -  PHQ-9 Score 0 - - - -  Difficult doing work/chores Not difficult at all - - - -   Fall Risk  04/14/2018 03/09/2018 02/03/2018 11/27/2017 10/15/2017  Falls in the past year? No No No No No    Relevant past medical, surgical, family and social history reviewed Past Medical History:  Diagnosis Date  . Obesity, Class III, BMI 40-49.9 (morbid obesity) ([place]) 02/09/2015   Past Surgical History:  Procedure  Laterality Date  . CESAREAN SECTION    . COLONOSCOPY WITH PROPOFOL N/A 03/25/2016   Procedure: COLONOSCOPY WITH PROPOFOL;  Surgeon: Lucilla Lame, MD;  Location: [facility] ENDOSCOPY;  Service: Endoscopy;  Laterality: N/A;  . TUBAL LIGATION     Family History  Problem Relation Age of Onset  . Hypertension Mother   . Thyroid disease Mother   . Diabetes Brother   . Heart attack Brother   . Heart disease Father 58       heart attack  . Breast cancer Paternal Aunt   . Asthma Maternal Grandmother   . Cancer Maternal Grandfather        prostate?  Marland Kitchen Heart attack Paternal Grandmother    Social History   Tobacco Use  . Smoking status: Former Smoker    Packs/day: 0.25    Years: 1.00    Pack years: 0.25    Last attempt to quit: 01/30/2011    Years since quitting: 7.2  . Smokeless tobacco: Never Used  Substance Use Topics  . Alcohol use: Yes    Alcohol/week: 4.0 - 7.0 standard  drinks    Types: 4 - 7 Standard drinks or equivalent per week    Comment: ocassional  . Drug use: No     [facility] Visit from 04/14/2018 in [facility]  AUDIT-C Score  3      Interim medical history since last visit reviewed. Allergies and medications reviewed  Review of Systems Per HPI unless specifically indicated above     Objective:    BP 136/82   Pulse 81   Temp 98.3 F (36.8 C) (Oral)   Ht 5\' 2"  (1.575 m)   Wt 227 lb 12.8 oz (103.3 kg)   LMP 04/05/2018   SpO2 95%   BMI 41.67 kg/m   Wt Readings from Last 3 Encounters:  04/14/18 227 lb 12.8 oz (103.3 kg)  03/09/18 234 lb 8 oz (106.4 kg)  02/03/18 231 lb 3.2 oz (104.9 kg)    Physical Exam  Constitutional: She appears well-developed and well-nourished. No distress.  Morbidly obese  HENT:  Mouth/Throat: Mucous membranes are normal.  Eyes: EOM are normal. No scleral icterus.  Cardiovascular: Normal rate and regular rhythm.  Pulmonary/Chest: Effort normal and breath sounds normal.  Genitourinary:    There is lesion (small  (2-3 mm) firm papule consistency of inclusion cyst) on the right labia. There is no lesion on the left labia. There is bleeding in the vagina.  Genitourinary Comments: Soft violaceous cystic nodular growth along the LEFT side labia near the introitus  Skin: No pallor.  Psychiatric: She has a normal mood and affect. Her behavior is normal. Her mood appears not anxious. She does not exhibit a depressed mood.       Assessment & Plan:   Problem List Items Addressed This Visit    None    Visit Diagnoses    Menorrhagia with regular cycle    -  Primary   check labs today including thyroid test and refer to GYN   Relevant Orders   Ambulatory referral to Obstetrics / Gynecology   CBC with Differential/Platelet (Completed)   FSH/LH (Completed)   Pregnancy, urine (Completed)   TSH (Completed)   Vaginal mass       refer to GYN   Relevant Orders   Ambulatory referral to Obstetrics / Gynecology   Vulvar cyst       looks like an inclusion cyst (on the RIGHT); she'll be seeing GYN for the other issues and will ask them to look at this as well   Relevant Orders   Ambulatory referral to Obstetrics / Gynecology       Follow up plan: No follow-ups on file.  An after-visit summary was printed and given to the patient at [facility].  Please see the patient instructions which may contain other information and recommendations beyond what is mentioned above in the assessment and plan.  No orders of the defined types were placed in this encounter.   Orders Placed This Encounter  Procedures  . CBC with Differential/Platelet  . FSH/LH  . Pregnancy, urine  . TSH  . Ambulatory referral to Obstetrics / Gynecology

## 2018-04-14 NOTE — Patient Instructions (Signed)
We'll get labs today We'll have you see the gynecologist

## 2018-04-17 LAB — CBC WITH DIFFERENTIAL/PLATELET
Basophils Absolute: 0 10*3/uL (ref 0.0–0.2)
Basos: 1 %
EOS (ABSOLUTE): 0.3 10*3/uL (ref 0.0–0.4)
Eos: 5 %
Hematocrit: 34.7 % (ref 34.0–46.6)
Hemoglobin: 11.2 g/dL (ref 11.1–15.9)
Immature Grans (Abs): 0 10*3/uL (ref 0.0–0.1)
Immature Granulocytes: 0 %
Lymphocytes Absolute: 2.3 10*3/uL (ref 0.7–3.1)
Lymphs: 36 %
MCH: 28.3 pg (ref 26.6–33.0)
MCHC: 32.3 g/dL (ref 31.5–35.7)
MCV: 88 fL (ref 79–97)
Monocytes Absolute: 0.5 10*3/uL (ref 0.1–0.9)
Monocytes: 8 %
Neutrophils Absolute: 3.2 10*3/uL (ref 1.4–7.0)
Neutrophils: 50 %
Platelets: 291 10*3/uL (ref 150–450)
RBC: 3.96 x10E6/uL (ref 3.77–5.28)
RDW: 12.5 % (ref 12.3–15.4)
WBC: 6.4 10*3/uL (ref 3.4–10.8)

## 2018-04-17 LAB — FSH/LH
FSH: 7.6 m[IU]/mL
LH: 6.8 m[IU]/mL

## 2018-04-17 LAB — TSH: TSH: 1.33 u[IU]/mL (ref 0.450–4.500)

## 2018-04-17 LAB — PREGNANCY, URINE: Preg Test, Ur: NEGATIVE

## 2018-04-23 ENCOUNTER — Ambulatory Visit: Payer: Managed Care, Other (non HMO) | Admitting: Obstetrics and Gynecology

## 2018-04-23 ENCOUNTER — Encounter: Payer: Self-pay | Admitting: Obstetrics and Gynecology

## 2018-04-23 VITALS — BP 136/93 | HR 75 | Ht 62.0 in | Wt 233.0 lb

## 2018-04-23 DIAGNOSIS — N898 Other specified noninflammatory disorders of vagina: Secondary | ICD-10-CM | POA: Diagnosis not present

## 2018-04-23 NOTE — Progress Notes (Signed)
Pt presents today as a referral from [facility]. Pt states she has a inner vaginal mass. Pt says the lump is not painful and has not bothered her. Her husband found it. Pt also complains of very heavy bleeding during her last period.

## 2018-04-23 NOTE — Progress Notes (Signed)
HPI:      Judy Robinson is a 47 y.o. No obstetric history on file. who LMP was Patient's last menstrual period was 04/05/2018 (exact date).  Subjective:   She presents today after her husband found a cystic structure within the vagina.  This was confirmed by her family doctor's examination.  Patient reports no symptoms from this.  She did not know it was there.  She has no idea how long it has been present.  She has a tubal ligation for birth control.  She has normal monthly menses.  She reports that her last menstrual.  Instead of lasting 5 days lasted 14 days-this was unusual for her.    Hx: The following portions of the patient's history were reviewed and updated as appropriate:             She  has a past medical history of Obesity, Class III, BMI 40-49.9 (morbid obesity) ([place]) (02/09/2015). She does not have any pertinent problems on file. She  has a past surgical history that includes Cesarean section; Tubal ligation; and Colonoscopy with propofol (N/A, 03/25/2016). Her family history includes Asthma in her maternal grandmother; Breast cancer in her paternal aunt; Cancer in her maternal grandfather; Diabetes in her brother; Heart attack in her brother and paternal grandmother; Heart disease (age of onset: 41) in her father; Hypertension in her mother; Thyroid disease in her mother. She  reports that she quit smoking about 7 years ago. She has a 0.25 pack-year smoking history. She has never used smokeless tobacco. She reports that she drinks about 4.0 - 7.0 standard drinks of alcohol per week. She reports that she does not use drugs. She has a current medication list which includes the following prescription(s): albuterol and budesonide-formoterol. She has No Known Allergies.       Review of Systems:  Review of Systems  Constitutional: Denied constitutional symptoms, night sweats, recent illness, fatigue, fever, insomnia and weight loss.  Eyes: Denied eye symptoms, eye pain,  photophobia, vision change and visual disturbance.  Ears/Nose/Throat/Neck: Denied ear, nose, throat or neck symptoms, hearing loss, nasal discharge, sinus congestion and sore throat.  Cardiovascular: Denied cardiovascular symptoms, arrhythmia, chest pain/pressure, edema, exercise intolerance, orthopnea and palpitations.  Respiratory: Denied pulmonary symptoms, asthma, pleuritic pain, productive sputum, cough, dyspnea and wheezing.  Gastrointestinal: Denied, gastro-esophageal reflux, melena, nausea and vomiting.  Genitourinary: See HPI for additional information.  Musculoskeletal: Denied musculoskeletal symptoms, stiffness, swelling, muscle weakness and myalgia.  Dermatologic: Denied dermatology symptoms, rash and scar.  Neurologic: Denied neurology symptoms, dizziness, headache, neck pain and syncope.  Psychiatric: Denied psychiatric symptoms, anxiety and depression.  Endocrine: Denied endocrine symptoms including hot flashes and night sweats.   Meds:   Current Outpatient Medications on File Prior to Visit  Medication Sig Dispense Refill  . albuterol (PROVENTIL HFA;VENTOLIN HFA) 108 (90 Base) MCG/ACT inhaler Inhale 2 puffs into the lungs every 4 (four) hours as needed for wheezing or shortness of breath. 1 Inhaler 1  . budesonide-formoterol (SYMBICORT) 80-4.5 MCG/ACT inhaler Inhale 2 puffs into the lungs 2 (two) times daily. 1 Inhaler 12   No current facility-administered medications on file prior to visit.     Objective:     Vitals:   04/23/18 1103  BP: (!) 136/93  Pulse: 75              Physical examination   Pelvic:   Vulva: Normal appearance.  No lesions.  Vagina:  There is a 2 cm cystic structure along  the left vaginal wall approximately 1 cm from the introitus.  It is nontender, not tense.  Support: Normal pelvic support.  Urethra No masses tenderness or scarring.  Meatus Normal size without lesions or prolapse.  Cervix: Normal appearance.  No lesions.  Anus: Normal exam.   No lesions.  Perineum: Normal exam.  No lesions.     Assessment:    No obstetric history on file. Patient Active Problem List   Diagnosis Date Noted  . Need for vaccination against measles 02/04/2018  . Essential hypertension, benign 02/03/2018  . Cough 12/21/2017  . Alcohol use 01/28/2017  . Benign neoplasm of sigmoid colon   . Preventative health care 01/30/2016  . Colon cancer screening 01/30/2016  . Blood pressure elevated without history of HTN 02/09/2015  . De Quervain's disease (radial styloid tenosynovitis) 02/09/2015  . Acne inversa 02/09/2015  . Obesity, Class III, BMI 40-49.9 (morbid obesity) ([place]) 02/09/2015  . Breast cancer screening 02/09/2015  . History of cigarette smoking 02/09/2015     1. 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  I have discussed this in detail with the patient.  We will employ expectant management over the next 4 weeks.  Consider continued expectant management versus marsupialization/excision. Orders No orders of the defined types were placed in this encounter.   No orders of the defined types were placed in this encounter.     F/U  Return in about 4 weeks (around 05/21/2018). I spent 22 minutes involved in the care of this patient of which greater than 50% was spent discussing her gynecologic history, presence of vaginal cyst, signs and symptoms, future follow-up and evaluation, possible treatment with excision.  All questions answered.  Finis Bud, M.D. 04/23/2018 11:39 AM

## 2018-05-28 ENCOUNTER — Encounter: Payer: Self-pay | Admitting: Obstetrics and Gynecology

## 2018-05-28 ENCOUNTER — Ambulatory Visit: Payer: Managed Care, Other (non HMO) | Admitting: Obstetrics and Gynecology

## 2018-05-28 VITALS — BP 152/85 | HR 64 | Ht 62.0 in | Wt 232.7 lb

## 2018-05-28 DIAGNOSIS — N898 Other specified noninflammatory disorders of vagina: Secondary | ICD-10-CM

## 2018-05-28 DIAGNOSIS — B372 Candidiasis of skin and nail: Secondary | ICD-10-CM | POA: Diagnosis not present

## 2018-05-28 NOTE — Progress Notes (Signed)
HPI:      [name] is a 47 y.o. No obstetric history on file. who LMP was Patient's last menstrual period was 05/18/2018.  Subjective:   She presents today for follow-up of a vaginal wall cyst.  The patient reports that she is having no problems from it.  "Most of the time I do not know it is there". She does complain of itching and rash in her groin area/mons.  She denies vaginal discharge or other vaginal issues.  She has tried [employer] powder without success.    Hx: The following portions of the patient's history were reviewed and updated as appropriate:             She  has a past medical history of Obesity, Class III, BMI 40-49.9 (morbid obesity) ([place]) (02/09/2015). She does not have any pertinent problems on file. She  has a past surgical history that includes Cesarean section; Tubal ligation; and Colonoscopy with propofol (N/A, 03/25/2016). Her family history includes Asthma in her maternal grandmother; Breast cancer in her paternal aunt; Cancer in her maternal grandfather; Diabetes in her brother; Heart attack in her brother and paternal grandmother; Heart disease (age of onset: 72) in her father; Hypertension in her mother; Thyroid disease in her mother. She  reports that she quit smoking about 7 years ago. She has a 0.25 pack-year smoking history. She has never used smokeless tobacco. She reports that she drinks about 4.0 - 7.0 standard drinks of alcohol per week. She reports that she does not use drugs. She has a current medication list which includes the following prescription(s): albuterol and budesonide-formoterol. She has No Known Allergies.       Review of Systems:  Review of Systems  Constitutional: Denied constitutional symptoms, night sweats, recent illness, fatigue, fever, insomnia and weight loss.  Eyes: Denied eye symptoms, eye pain, photophobia, vision change and visual disturbance.  Ears/Nose/Throat/Neck: Denied ear, nose, throat or neck symptoms, hearing  loss, nasal discharge, sinus congestion and sore throat.  Cardiovascular: Denied cardiovascular symptoms, arrhythmia, chest pain/pressure, edema, exercise intolerance, orthopnea and palpitations.  Respiratory: Denied pulmonary symptoms, asthma, pleuritic pain, productive sputum, cough, dyspnea and wheezing.  Gastrointestinal: Denied, gastro-esophageal reflux, melena, nausea and vomiting.  Genitourinary: See HPI for additional information.  Musculoskeletal: Denied musculoskeletal symptoms, stiffness, swelling, muscle weakness and myalgia.  Dermatologic: Denied dermatology symptoms, rash and scar.  Neurologic: Denied neurology symptoms, dizziness, headache, neck pain and syncope.  Psychiatric: Denied psychiatric symptoms, anxiety and depression.  Endocrine: Denied endocrine symptoms including hot flashes and night sweats.   Meds:   Current Outpatient Medications on File Prior to Visit  Medication Sig Dispense Refill  . albuterol (PROVENTIL HFA;VENTOLIN HFA) 108 (90 Base) MCG/ACT inhaler Inhale 2 puffs into the lungs every 4 (four) hours as needed for wheezing or shortness of breath. 1 Inhaler 1  . budesonide-formoterol (SYMBICORT) 80-4.5 MCG/ACT inhaler Inhale 2 puffs into the lungs 2 (two) times daily. 1 Inhaler 12   No current facility-administered medications on file prior to visit.     Objective:     Vitals:   05/28/18 1118  BP: (!) 152/85  Pulse: 64              Could not specifically identify the rash in question because patient had large amount of powder present.  Assessment:    No obstetric history on file. Patient Active Problem List   Diagnosis Date Noted  . Need for vaccination against measles 02/04/2018  . Essential hypertension,  benign 02/03/2018  . Cough 12/21/2017  . Alcohol use 01/28/2017  . Benign neoplasm of sigmoid colon   . Preventative health care 01/30/2016  . Colon cancer screening 01/30/2016  . Blood pressure elevated without history of HTN 02/09/2015   . De Quervain's disease (radial styloid tenosynovitis) 02/09/2015  . Acne inversa 02/09/2015  . Obesity, Class III, BMI 40-49.9 (morbid obesity) ([employer]) 02/09/2015  . Breast cancer screening 02/09/2015  . History of cigarette smoking 02/09/2015     1. Vaginal wall cyst   2. Moniliasis, cutaneous     Vaginal wall cyst stable-not an issue for her-no follow-up necessary unless it becomes problematic.  Patient has complained of itching and rash in the area that is known for monilia.   Plan:            1.  Advised treatment of cutaneous monilia with antifungal cream twice daily for 1 week.  Expect rapid complete resolution.  2.  Advised patient to follow-up regularly with her family physician for routine medical care. Orders No orders of the defined types were placed in this encounter.   No orders of the defined types were placed in this encounter.     F/U  No follow-ups on file. I spent 9 minutes involved in the care of this patient of which greater than 50% was spent discussing vaginal wall cyst, cutaneous monilia and treatment.  Finis Bud, M.D. 05/28/2018 11:34 AM

## 2018-09-07 ENCOUNTER — Encounter: Payer: Self-pay | Admitting: Family Medicine

## 2018-09-07 ENCOUNTER — Ambulatory Visit: Payer: Managed Care, Other (non HMO) | Admitting: Family Medicine

## 2018-09-07 VITALS — BP 128/76 | HR 74 | Temp 98.7°F | Resp 12 | Ht 62.0 in | Wt 237.2 lb

## 2018-09-07 DIAGNOSIS — Z23 Encounter for immunization: Secondary | ICD-10-CM | POA: Diagnosis not present

## 2018-09-07 DIAGNOSIS — I10 Essential (primary) hypertension: Secondary | ICD-10-CM

## 2018-09-07 DIAGNOSIS — R059 Cough, unspecified: Secondary | ICD-10-CM

## 2018-09-07 DIAGNOSIS — R05 Cough: Secondary | ICD-10-CM

## 2018-09-07 NOTE — Assessment & Plan Note (Addendum)
She'll keep working on things for now; did not want any referrals; encouragement given

## 2018-09-07 NOTE — Assessment & Plan Note (Signed)
Explained goal is less than 120, ideally; adequate control; try to do DASH guidelines

## 2018-09-07 NOTE — Patient Instructions (Addendum)
Consider baked potato chips instead of fried Consider dried edamame (soy beans) Other healthy snacks like carrots and hummus  Check out the information at familydoctor.org entitled "Nutrition for Weight Loss: What You Need to Know about Fad Diets" Try to lose between 1-2 pounds per week by taking in fewer calories and burning off more calories You can succeed by limiting portions, limiting foods dense in calories and fat, becoming more active, and drinking 8 glasses of water a day (64 ounces) Don't skip meals, especially breakfast, as skipping meals may alter your metabolism Do not use over-the-counter weight loss pills or gimmicks that claim rapid weight loss A healthy BMI (or body mass index) is between 18.5 and 24.9 You can calculate your ideal BMI at the [place] website ClubMonetize.fr   Preventing Unhealthy Weight Gain, Adult Staying at a healthy weight is important to your overall health. When fat builds up in your body, you may become overweight or obese. Being overweight or obese increases your risk of developing certain health problems, such as heart disease, diabetes, sleeping problems, joint problems, and some types of cancer. Unhealthy weight gain is often the result of making unhealthy food choices or not getting enough exercise. You can make changes to your lifestyle to prevent obesity and stay as healthy as possible. What nutrition changes can be made?   Eat only as much as your body needs. To do this: ? Pay attention to signs that you are hungry or full. Stop eating as soon as you feel full. ? If you feel hungry, try drinking water first before eating. Drink enough water so your urine is clear or pale yellow. ? Eat smaller portions. Pay attention to portion sizes when eating out. ? Look at serving sizes on food labels. Most foods contain more than one serving per container. ? Eat the recommended number of calories for your gender  and activity level. For most active people, a daily total of 2,000 calories is appropriate. If you are trying to lose weight or are not very active, you may need to eat fewer calories. Talk with your health care provider or a diet and nutrition specialist (dietitian) about how many calories you need each day.  Choose healthy foods, such as: ? Fruits and vegetables. At each meal, try to fill at least half of your plate with fruits and vegetables. ? Whole grains, such as whole-wheat bread, brown rice, and quinoa. ? Lean meats, such as chicken or fish. ? Other healthy proteins, such as beans, eggs, or tofu. ? Healthy fats, such as nuts, seeds, fatty fish, and olive oil. ? Low-fat or fat-free dairy products.  Check food labels, and avoid food and drinks that: ? Are high in calories. ? Have added sugar. ? Are high in sodium. ? Have saturated fats or trans fats.  Cook foods in healthier ways, such as by baking, broiling, or grilling.  Make a meal plan for the week, and shop with a grocery list to help you stay on track with your purchases. Try to avoid going to the grocery store when you are hungry.  When grocery shopping, try to shop around the outside of the store first, where the fresh foods are. Doing this helps you to avoid prepackaged foods, which can be high in sugar, salt (sodium), and fat. What lifestyle changes can be made?   Exercise for 30 or more minutes on 5 or more days each week. Exercising may include brisk walking, yard work, biking, running, swimming, and team sports  like basketball and soccer. Ask your health care provider which exercises are safe for you.  Do muscle-strengthening activities, such as lifting weights or using resistance bands, on 2 or more days a week.  Do not use any products that contain nicotine or tobacco, such as cigarettes and e-cigarettes. If you need help quitting, ask your health care provider.  Limit alcohol intake to no more than 1 drink a day  for nonpregnant women and 2 drinks a day for men. One drink equals 12 oz of beer, 5 oz of wine, or 1 oz of hard liquor.  Try to get 7-9 hours of sleep each night. What other changes can be made?  Keep a food and activity journal to keep track of: ? What you ate and how many calories you had. Remember to count the calories in sauces, dressings, and side dishes. ? Whether you were active, and what exercises you did. ? Your calorie, weight, and activity goals.  Check your weight regularly. Track any changes. If you notice you have gained weight, make changes to your diet or activity routine.  Avoid taking weight-loss medicines or supplements. Talk to your health care provider before starting any new medicine or supplement.  Talk to your health care provider before trying any new diet or exercise plan. Why are these changes important? Eating healthy, staying active, and having healthy habits can help you to prevent obesity. Those changes also:  Help you manage stress and emotions.  Help you connect with friends and family.  Improve your self-esteem.  Improve your sleep.  Prevent long-term health problems. What can happen if changes are not made? Being obese or overweight can cause you to develop joint or bone problems, which can make it hard for you to stay active or do activities you enjoy. Being obese or overweight also puts stress on your heart and lungs and can lead to health problems like diabetes, heart disease, and some cancers. Where to find more information Talk with your health care provider or a dietitian about healthy eating and healthy lifestyle choices. You may also find information from:  U.S. Department of Agriculture, MyPlate: FormerBoss.no  American Heart Association: www.heart.org  Centers for Disease Control and Prevention: http://www.wolf.info/ Summary  Staying at a healthy weight is important to your overall health. It helps you to prevent certain diseases and  health problems, such as heart disease, diabetes, joint problems, sleep disorders, and some types of cancer.  Being obese or overweight can cause you to develop joint or bone problems, which can make it hard for you to stay active or do activities you enjoy.  You can prevent unhealthy weight gain by eating a healthy diet, exercising regularly, not smoking, limiting alcohol, and getting enough sleep.  Talk with your health care provider or a dietitian for guidance about healthy eating and healthy lifestyle choices. This information is not intended to replace advice given to you by your health care provider. Make sure you discuss any questions you have with your health care provider. Document Released: 06/24/2016 Document Revised: 04/03/2017 Document Reviewed: 07/30/2016 Elsevier Interactive Patient Education  2019 [employer].

## 2018-09-07 NOTE — Progress Notes (Signed)
BP 128/76   Pulse 74   Temp 98.7 F (37.1 C) (Oral)   Resp 12   Ht 5\' 2"  (1.575 m)   Wt 237 lb 3.2 oz (107.6 kg)   LMP 08/21/2018   SpO2 98%   BMI 43.38 kg/m    Subjective:    Patient ID: Judy Robinson, female    DOB: 04-May-1971, 48 y.o.   MRN: [id number]  HPI: Judy Robinson is a 48 y.o. female  Chief Complaint  Patient presents with  . Follow-up    HPI  Here for f/u No medical excitement No true diagnosis of asthma; had episode of coughing; that's completely resolved now Not using any inhalers right now for her asthma; she thinks passive smoke exposure was the trigger No use since December; on her own, no issues with insurance Has never had to go to the ER for asthma; no ER trips or hospital visits Grandmother had it really bad No SABA since December Flu shot today  Got the measles vaccine  Morbid obesity; she stress eats Drinking about 4 of the 16 ounce water bottles daily Does have it on her phone to monitor her steps; does not do as much as she used to Job is interfering with exercising  She had heavy periods in the fall; saw GYN and nothing to worry about; just one other time; not anemic  The 10-year ASCVD risk score Mikey Bussing DC Brooke Bonito., et al., 2013) is: 2.1%   Values used to calculate the score:     Age: 15 years     Sex: Female     Is Non-Hispanic African American: Yes     Diabetic: No     Tobacco smoker: No     Systolic Blood Pressure: 102 mmHg     Is BP treated: No     HDL Cholesterol: 41 mg/dL     Total Cholesterol: 168 mg/dL  HTN; controlled today; chronic issue  Depression screen [facility] 2/9 09/07/2018 04/14/2018 04/14/2018 02/03/2018 11/27/2017  Decreased Interest 0 0 0 0 0  Down, Depressed, Hopeless 0 0 0 0 0  PHQ - 2 Score 0 0 0 0 0  Altered sleeping 0 0 - - -  Tired, decreased energy 0 0 - - -  Change in appetite 0 0 - - -  Feeling bad or failure about yourself  0 0 - - -  Trouble concentrating 0 0 - - -  Moving slowly or  fidgety/restless 0 0 - - -  Suicidal thoughts 0 0 - - -  PHQ-9 Score 0 0 - - -  Difficult doing work/chores Not difficult at all Not difficult at all - - -   Fall Risk  09/07/2018 04/14/2018 03/09/2018 02/03/2018 11/27/2017  Falls in the past year? 0 No No No No  Number falls in past yr: 0 - - - -  Injury with Fall? 0 - - - -    Relevant past medical, surgical, family and social history reviewed Past Medical History:  Diagnosis Date  . Obesity, Class III, BMI 40-49.9 (morbid obesity) ([place]) 02/09/2015   Past Surgical History:  Procedure Laterality Date  . CESAREAN SECTION    . COLONOSCOPY WITH PROPOFOL N/A 03/25/2016   Procedure: COLONOSCOPY WITH PROPOFOL;  Surgeon: Lucilla Lame, MD;  Location: [facility] ENDOSCOPY;  Service: Endoscopy;  Laterality: N/A;  . TUBAL LIGATION     Family History  Problem Relation Age of Onset  . Hypertension Mother   . Thyroid disease  Mother   . Diabetes Brother   . Heart attack Brother   . Heart disease Father 33       heart attack  . Breast cancer Paternal Aunt   . Asthma Maternal Grandmother   . Cancer Maternal Grandfather        prostate?  Marland Kitchen Heart attack Paternal Grandmother    Social History   Tobacco Use  . Smoking status: Former Smoker    Packs/day: 0.25    Years: 1.00    Pack years: 0.25    Last attempt to quit: 01/30/2011    Years since quitting: 7.6  . Smokeless tobacco: Never Used  Substance Use Topics  . Alcohol use: Yes    Alcohol/week: 4.0 - 7.0 standard drinks    Types: 4 - 7 Standard drinks or equivalent per week    Comment: ocassional  . Drug use: No  MD note: no more than 7 drinks    Office Visit from 09/07/2018 in [facility]  AUDIT-C Score  0      Interim medical history since last visit reviewed. Allergies and medications reviewed  Review of Systems  Constitutional: Negative for unexpected weight change.  Respiratory: Negative for wheezing.   Cardiovascular: Negative for chest pain.   Per HPI unless  specifically indicated above     Objective:    BP 128/76   Pulse 74   Temp 98.7 F (37.1 C) (Oral)   Resp 12   Ht 5\' 2"  (1.575 m)   Wt 237 lb 3.2 oz (107.6 kg)   LMP 08/21/2018   SpO2 98%   BMI 43.38 kg/m   Wt Readings from Last 3 Encounters:  09/07/18 237 lb 3.2 oz (107.6 kg)  05/28/18 232 lb 11.2 oz (105.6 kg)  04/23/18 233 lb (105.7 kg)    Physical Exam Constitutional:      General: She is not in acute distress.    Appearance: She is well-developed. She is obese. She is not diaphoretic.  HENT:     Head: Normocephalic and atraumatic.  Eyes:     General: No scleral icterus. Neck:     Thyroid: No thyromegaly.  Cardiovascular:     Rate and Rhythm: Normal rate and regular rhythm.     Heart sounds: Normal heart sounds. No murmur.  Pulmonary:     Effort: Pulmonary effort is normal. No respiratory distress.     Breath sounds: Normal breath sounds. No wheezing.  Abdominal:     General: Bowel sounds are normal. There is no distension.     Palpations: Abdomen is soft.  Skin:    General: Skin is warm and dry.     Coloration: Skin is not pale.  Neurological:     Mental Status: She is alert.  Psychiatric:        Behavior: Behavior normal.        Thought Content: Thought content normal.        Judgment: Judgment normal.     Results for orders placed or performed in visit on 04/14/18  CBC with Differential/Platelet  Result Value Ref Range   WBC 6.4 3.4 - 10.8 x10E3/uL   RBC 3.96 3.77 - 5.28 x10E6/uL   Hemoglobin 11.2 11.1 - 15.9 g/dL   Hematocrit 34.7 34.0 - 46.6 %   MCV 88 79 - 97 fL   MCH 28.3 26.6 - 33.0 pg   MCHC 32.3 31.5 - 35.7 g/dL   RDW 12.5 12.3 - 15.4 %   Platelets 291 150 -  450 x10E3/uL   Neutrophils 50 Not Estab. %   Lymphs 36 Not Estab. %   Monocytes 8 Not Estab. %   Eos 5 Not Estab. %   Basos 1 Not Estab. %   Neutrophils Absolute 3.2 1.4 - 7.0 x10E3/uL   Lymphocytes Absolute 2.3 0.7 - 3.1 x10E3/uL   Monocytes Absolute 0.5 0.1 - 0.9 x10E3/uL    EOS (ABSOLUTE) 0.3 0.0 - 0.4 x10E3/uL   Basophils Absolute 0.0 0.0 - 0.2 x10E3/uL   Immature Granulocytes 0 Not Estab. %   Immature Grans (Abs) 0.0 0.0 - 0.1 x10E3/uL  FSH/LH  Result Value Ref Range   LH 6.8 mIU/mL   FSH 7.6 mIU/mL  Pregnancy, urine  Result Value Ref Range   Preg Test, Ur Negative Negative  TSH  Result Value Ref Range   TSH 1.330 0.450 - 4.500 uIU/mL      Assessment & Plan:   Problem List Items Addressed This Visit      Cardiovascular and Mediastinum   Essential hypertension, benign - Primary (Chronic)    Explained goal is less than 120, ideally; adequate control; try to do DASH guidelines        Other   Obesity, Class III, BMI 40-49.9 (morbid obesity) ([facility])    She'll keep working on things for now; did not want any referrals; encouragement given      Cough    resolved       Other Visit Diagnoses    Need for influenza vaccination       Relevant Orders   Flu Vaccine QUAD 6+ mos PF IM (Fluarix Quad PF) (Completed)       Follow up plan: No follow-ups on file.  An after-visit summary was printed and given to the patient at [facility].  Please see the patient instructions which may contain other information and recommendations beyond what is mentioned above in the assessment and plan.  No orders of the defined types were placed in this encounter.   Orders Placed This Encounter  Procedures  . Flu Vaccine QUAD 6+ mos PF IM (Fluarix Quad PF)

## 2018-09-17 NOTE — Assessment & Plan Note (Signed)
resolved 

## 2018-11-30 ENCOUNTER — Other Ambulatory Visit: Payer: Self-pay | Admitting: Sports Medicine

## 2018-11-30 DIAGNOSIS — M25562 Pain in left knee: Secondary | ICD-10-CM

## 2018-11-30 DIAGNOSIS — G8929 Other chronic pain: Secondary | ICD-10-CM

## 2018-12-08 ENCOUNTER — Other Ambulatory Visit: Payer: Self-pay

## 2018-12-08 ENCOUNTER — Ambulatory Visit
Admission: RE | Admit: 2018-12-08 | Discharge: 2018-12-08 | Disposition: A | Discharge status: Home / Self Care | Payer: Managed Care, Other (non HMO) | Source: Ambulatory Visit | Attending: Sports Medicine | Admitting: Sports Medicine

## 2018-12-08 DIAGNOSIS — G8929 Other chronic pain: Secondary | ICD-10-CM | POA: Diagnosis present

## 2018-12-08 DIAGNOSIS — M25562 Pain in left knee: Secondary | ICD-10-CM | POA: Insufficient documentation

## 2019-02-07 ENCOUNTER — Encounter: Payer: Managed Care, Other (non HMO) | Admitting: Family Medicine

## 2019-02-14 ENCOUNTER — Encounter: Payer: Self-pay | Admitting: Nurse Practitioner

## 2019-02-14 ENCOUNTER — Other Ambulatory Visit: Payer: Self-pay

## 2019-02-14 ENCOUNTER — Ambulatory Visit (INDEPENDENT_AMBULATORY_CARE_PROVIDER_SITE_OTHER): Payer: Managed Care, Other (non HMO) | Admitting: Nurse Practitioner

## 2019-02-14 VITALS — BP 130/78 | HR 83 | Temp 96.9°F | Resp 14 | Ht 64.0 in | Wt 248.4 lb

## 2019-02-14 DIAGNOSIS — Z13220 Encounter for screening for lipoid disorders: Secondary | ICD-10-CM

## 2019-02-14 DIAGNOSIS — Z131 Encounter for screening for diabetes mellitus: Secondary | ICD-10-CM | POA: Diagnosis not present

## 2019-02-14 DIAGNOSIS — Z1239 Encounter for other screening for malignant neoplasm of breast: Secondary | ICD-10-CM

## 2019-02-14 DIAGNOSIS — Z0000 Encounter for general adult medical examination without abnormal findings: Secondary | ICD-10-CM | POA: Diagnosis not present

## 2019-02-14 NOTE — Patient Instructions (Addendum)
-   Working on Designer, fashion/clothing and snacking on healthy foods.   General recommendations: 150 minutes of physical activity weekly, eat two servings of fish weekly, eat one serving of tree nuts ( cashews, pistachios, pecans, almonds.Marland Kitchen) every other day, eat 6 servings of fruit/vegetables daily and drink plenty of water and avoid sweet beverages. Recommend at least 64 ounces of water daily.   Please do call to schedule your mammogram; the number to schedule one at either [facility] or [facility] is [phone]   Good cholesterol, also called high-density lipoprotein (HDL) removes extra cholesterol and plaque buildup in your arteries and then sends it to your liver to get rid of and helps reduce your risk of heart disease, heart attack, and stroke.Foods that increase HDL: beans and legumes, whole grains, high-fiber fruits:prunes, apples, and pears; fatty fish- salmon, tuna, sardines; nuts, olive oil   Bad cholesterol, also called low-density lipoprotein (LDL), carries cholesterol and other fats that your liver makes to your body tissue. If it builds up in blood vessels, LDL can cause heart disease and other health problems. Your LDL level should be below 100. If you have diabetes or a possible heart problem, your LDL should be below 70.  Eat: Eat 20 to 30 grams of soluble fiber every day. Foods such as fruits and vegetables, whole grains, beans, peas, nuts, and seeds can help lower LDL. Avoid: Saturated fats (Dairy foods - such as butter, cream, ghee, regular-fat milk and cheese. Meat - such as fatty cuts of beef, pork and lamb, processed meats like salami, sausages and the skin on chicken. Lard., fatty snack foods, cakes, biscuits, pies and deep fried foods)    Stay Safe in the Sun The majority of sun exposure occurs before age 3 and skin cancer can take 20 years or more to develop. Whether your sun bathing days are behind you or you still spend time pursuing the perfect  tan, you should be concerned about skin cancer.  Remember, the sun's ultraviolet (UV) rays can reflect off water, sand, concrete and snow, and can reach below the water's surface. Certain types of UV light penetrate fog and clouds, so it's possible to get sunburn even on overcast days.  Avoid direct sunlight as much as possible during the peak sun hours, generally 10 a.m. to 3 p.m., or seek shade during this part of day. Wear broad-spectrum sunscreen - with an SPF of at least 30 - containing both UVA and UVB protection. Look for ingredients like [employer] (also known as avobenzone) or titanium dioxide on the label. Reapply sunscreen frequently, at least every two hours when outdoors, especially if you perspire or you've been swimming. Your best bet is to choose water-resistant products that are more likely to stay on your skin. Wear lip balm with an SPF 15 or higher. Wear a hat and other protective clothing while in the sun. Tightly woven fibers and darker clothing generally provide more protection. Also, look for products approved by the American Academy of Dermatology. Wear UV-protective sunglasses.

## 2019-02-14 NOTE — Progress Notes (Signed)
Name: Judy Robinson   MRN: [id number]    DOB: Sep 29, 1970   Date:02/14/2019       Progress Note  Subjective  Chief Complaint  Chief Complaint  Patient presents with  . Annual Exam    last pap 2018    HPI   Patient presents for annual CPE.  Diet: snacks throughout the day since working from home.  Breakfast: banana or apples, coffee Snacks on- peanut butter crackers, chips, pretzels Lunch- Varies- might eat eggs or out.  Dinner- cooks at home- chicken or beef and sides- 2 veggies Drinks-water- 2-4 bottles , occasional soda Exercise:  none  USPSTF grade A and B recommendations    Office Visit from 02/14/2019 in [facility]  AUDIT-C Score  4     Depression: Phq 9 is  negative Depression screen [facility] 2/9 02/14/2019 09/07/2018 04/14/2018 04/14/2018 02/03/2018  Decreased Interest 0 0 0 0 0  Down, Depressed, Hopeless 0 0 0 0 0  PHQ - 2 Score 0 0 0 0 0  Altered sleeping 0 0 0 - -  Tired, decreased energy 0 0 0 - -  Change in appetite 0 0 0 - -  Feeling bad or failure about yourself  0 0 0 - -  Trouble concentrating 0 0 0 - -  Moving slowly or fidgety/restless 0 0 0 - -  Suicidal thoughts 0 0 0 - -  PHQ-9 Score 0 0 0 - -  Difficult doing work/chores Not difficult at all Not difficult at all Not difficult at all - -   Hypertension: BP Readings from Last 3 Encounters:  02/14/19 130/78  09/07/18 128/76  05/28/18 (!) 152/85   Obesity: Wt Readings from Last 3 Encounters:  02/14/19 248 lb 6.4 oz (112.7 kg)  09/07/18 237 lb 3.2 oz (107.6 kg)  05/28/18 232 lb 11.2 oz (105.6 kg)   BMI Readings from Last 3 Encounters:  02/14/19 42.64 kg/m  09/07/18 43.38 kg/m  05/28/18 42.56 kg/m    Hep C Screening: denies  STD testing and prevention (HIV/chl/gon/syphilis): denies  Intimate partner violence: denies  Sexual History/Pain during Intercourse: denies  Menstrual History/LMP/Abnormal Bleeding: denies abnormal bleeding; monthly periods   Advanced Care  Planning: A voluntary discussion about advance care planning including the explanation and discussion of advance directives.  Discussed health care proxy and Living will, and the patient was able to identify a health care proxy as Valeda Malm, husband.  Patient does not have a living will at present time. If patient does have living will, I have requested they bring this to the clinic to be scanned in to their chart.  Breast cancer: due for mammogram No results found for: [facility]  Cervical cancer screening: due 2021  Osteoporosis Screening: discussed.  Lipids:  Lab Results  Component Value Date   CHOL 168 02/03/2018   CHOL 183 01/28/2017   CHOL 190 01/30/2016   Lab Results  Component Value Date   HDL 41 02/03/2018   HDL 44 01/28/2017   HDL 41 01/30/2016   Lab Results  Component Value Date   LDLCALC 113 (H) 02/03/2018   LDLCALC 118 (H) 01/28/2017   LDLCALC 123 (H) 01/30/2016   Lab Results  Component Value Date   TRIG 71 02/03/2018   TRIG 103 01/28/2017   TRIG 130 01/30/2016   Lab Results  Component Value Date   CHOLHDL 4.1 02/03/2018   CHOLHDL 4.2 01/28/2017   CHOLHDL 4.6 (H) 01/30/2016   No results found for:  LDLDIRECT  Glucose:  Glucose  Date Value Ref Range Status  02/03/2018 91 65 - 99 mg/dL Final  01/28/2017 96 65 - 99 mg/dL Final  01/30/2016 103 (H) 65 - 99 mg/dL Final    Comment:    Specimen received in contact with cells. No visible hemolysis present. However GLUC may be decreased and K increased. Clinical correlation indicated.     Skin cancer: discussed Colorectal cancer: discussed   Lung cancer:  Low Dose CT Chest recommended if Age 56-80 years, 30 pack-year currently smoking OR have quit w/in 15years. Patient does not qualify.    Patient Active Problem List   Diagnosis Date Noted  . Benign neoplasm of sigmoid colon   . De Quervain's disease (radial styloid tenosynovitis) 02/09/2015  . Acne inversa 02/09/2015  . Obesity, Class III, BMI 40-49.9  (morbid obesity) ([place]) 02/09/2015    Past Surgical History:  Procedure Laterality Date  . CESAREAN SECTION    . COLONOSCOPY WITH PROPOFOL N/A 03/25/2016   Procedure: COLONOSCOPY WITH PROPOFOL;  Surgeon: Lucilla Lame, MD;  Location: [facility] ENDOSCOPY;  Service: Endoscopy;  Laterality: N/A;  . TUBAL LIGATION      Family History  Problem Relation Age of Onset  . Hypertension Mother   . Thyroid disease Mother   . Diabetes Brother   . Heart attack Brother   . Heart disease Father 72       heart attack  . Breast cancer Paternal Aunt   . Asthma Maternal Grandmother   . Cancer Maternal Grandfather        prostate?  Marland Kitchen Heart attack Paternal Grandmother     Social History   Socioeconomic History  . Marital status: Married    Spouse name: Not on file  . Number of children: Not on file  . Years of education: Not on file  . Highest education level: Not on file  Occupational History  . Not on file  Social Needs  . Financial resource strain: Not on file  . Food insecurity    Worry: Never true    Inability: Never true  . Transportation needs    Medical: No    Non-medical: No  Tobacco Use  . Smoking status: Former Smoker    Packs/day: 0.25    Years: 1.00    Pack years: 0.25    Quit date: 01/30/2011    Years since quitting: 8.0  . Smokeless tobacco: Never Used  Substance and Sexual Activity  . Alcohol use: Yes    Alcohol/week: 4.0 - 7.0 standard drinks    Types: 4 - 7 Standard drinks or equivalent per week    Comment: ocassional  . Drug use: No  . Sexual activity: Yes    Partners: Male  Lifestyle  . Physical activity    Days per week: 0 days    Minutes per session: 0 min  . Stress: Only a little  Relationships  . Social connections    Talks on phone: More than three times a week    Gets together: Once a week    Attends religious service: Never    Active member of club or organization: No    Attends meetings of clubs or organizations: Never    Relationship status:  Married  . Intimate partner violence    Fear of current or ex partner: No    Emotionally abused: No    Physically abused: No    Forced sexual activity: No  Other Topics Concern  . Not on file  Social History Narrative  . Not on file    No current outpatient medications on file.  No Known Allergies   Review of Systems  Constitutional: Negative for chills, fever and malaise/fatigue.  HENT: Negative for congestion, sinus pain and sore throat.   Eyes: Negative for blurred vision.  Respiratory: Negative for cough and shortness of breath.   Cardiovascular: Negative for chest pain, palpitations and leg swelling.  Gastrointestinal: Negative for abdominal pain, constipation, diarrhea and nausea.  Genitourinary: Negative for dysuria.  Musculoskeletal: Negative for falls and joint pain.  Skin: Negative for rash.  Neurological: Negative for dizziness and headaches.  Endo/Heme/Allergies: Negative for polydipsia.  Psychiatric/Behavioral: The patient is not nervous/anxious and does not have insomnia.       Objective  Vitals:   02/14/19 0822  BP: 130/78  Pulse: 83  Resp: 14  Temp: (!) 96.9 F (36.1 C)  SpO2: 96%  Weight: 248 lb 6.4 oz (112.7 kg)  Height: 5\' 4"  (1.626 m)    Body mass index is 42.64 kg/m.  Physical Exam Constitutional: Patient appears well-developed and well-nourished. No distress.  HENT: Head: Normocephalic and atraumatic. Ears: B TMs ok, no erythema or effusion; Eyes: Conjunctivae and EOM are normal. Pupils are equal, round, and reactive to light. No scleral icterus.  Neck: Normal range of motion. Neck supple. No JVD present. No thyromegaly present.  Cardiovascular: Normal rate, regular rhythm and normal heart sounds.  No murmur heard. No BLE edema. Pulmonary/Chest: Effort normal and breath sounds normal. No respiratory distress. Abdominal: Soft. Bowel sounds are normal, no distension. There is no tenderness. no masses Breast: no lumps or masses, no nipple  discharge or rashes FEMALE GENITALIA: deferred Musculoskeletal: Normal range of motion, no joint effusions. No gross deformities Neurological: he is alert and oriented to person, place, and time. No cranial nerve deficit. Coordination, balance, strength, speech and gait are normal.  Skin: Skin is warm and dry. No rash noted. No erythema.  Psychiatric: Patient has a normal mood and affect. behavior is normal. Judgment and thought content normal.   No results found for this or any previous visit (from the past 2160 hour(s)).    Fall Risk: Fall Risk  02/14/2019 09/07/2018 04/14/2018 03/09/2018 02/03/2018  Falls in the past year? 0 0 No No No  Number falls in past yr: 0 0 - - -  Injury with Fall? 0 0 - - -     Functional Status Survey: Is the patient deaf or have difficulty hearing?: No Does the patient have difficulty seeing, even when wearing glasses/contacts?: No Does the patient have difficulty concentrating, remembering, or making decisions?: No Does the patient have difficulty walking or climbing stairs?: No Does the patient have difficulty dressing or bathing?: No Does the patient have difficulty doing errands alone such as visiting a doctor's office or shopping?: No   Assessment & Plan  1. Preventative health care - Lipid Profile - COMPLETE METABOLIC PANEL WITH GFR  2. Screening for breast cancer - MM 3D SCREEN BREAST BILATERAL; Future  3. Screening for lipid disorders - Lipid Profile  4. Screening for diabetes mellitus - COMPLETE METABOLIC PANEL WITH GFR   -USPSTF grade A and B recommendations reviewed with patient; age-appropriate recommendations, preventive care, screening tests, etc discussed and encouraged; healthy living encouraged; see AVS for patient education given to patient -Discussed importance of 150 minutes of physical activity weekly, eat two servings of fish weekly, eat one serving of tree nuts ( cashews, pistachios, pecans, almonds.Marland Kitchen) every other day,  eat 6  servings of fruit/vegetables daily and drink plenty of water and avoid sweet beverages.   -Reviewed Health Maintenance: mammogram

## 2019-02-15 LAB — COMPREHENSIVE METABOLIC PANEL
ALT: 17 IU/L (ref 0–32)
AST: 18 IU/L (ref 0–40)
Albumin/Globulin Ratio: 1.3 (ref 1.2–2.2)
Albumin: 4.2 g/dL (ref 3.8–4.8)
Alkaline Phosphatase: 79 IU/L (ref 39–117)
BUN/Creatinine Ratio: 16 (ref 9–23)
BUN: 13 mg/dL (ref 6–24)
Bilirubin Total: <0.2 mg/dL (ref 0.0–1.2)
CO2: 22 mmol/L (ref 20–29)
Calcium: 9.2 mg/dL (ref 8.7–10.2)
Chloride: 103 mmol/L (ref 96–106)
Creatinine, Ser: 0.83 mg/dL (ref 0.57–1.00)
GFR calc Af Amer: 96 mL/min/{1.73_m2} (ref >59)
GFR calc non Af Amer: 83 mL/min/{1.73_m2} (ref >59)
Globulin, Total: 3.2 g/dL (ref 1.5–4.5)
Glucose: 109 mg/dL — ABNORMAL HIGH (ref 65–99)
Potassium: 4.4 mmol/L (ref 3.5–5.2)
Sodium: 139 mmol/L (ref 134–144)
Total Protein: 7.4 g/dL (ref 6.0–8.5)

## 2019-02-15 LAB — LIPID PANEL
Chol/HDL Ratio: 4 ratio (ref 0.0–4.4)
Cholesterol, Total: 182 mg/dL (ref 100–199)
HDL: 46 mg/dL (ref >39)
LDL Calculated: 117 mg/dL — ABNORMAL HIGH (ref 0–99)
Triglycerides: 95 mg/dL (ref 0–149)
VLDL Cholesterol Cal: 19 mg/dL (ref 5–40)

## 2019-08-19 ENCOUNTER — Ambulatory Visit: Payer: Managed Care, Other (non HMO) | Admitting: Internal Medicine

## 2019-10-29 ENCOUNTER — Ambulatory Visit: Payer: Managed Care, Other (non HMO) | Attending: Internal Medicine

## 2019-10-29 DIAGNOSIS — Z23 Encounter for immunization: Secondary | ICD-10-CM

## 2019-10-29 NOTE — Progress Notes (Signed)
   Covid-19 Vaccination Clinic  Name:  Judy Robinson    MRN: [id number] DOB: [birth year]  10/29/2019  [name] was observed post Covid-19 immunization for 15 minutes without incident. She was provided with Vaccine Information Sheet and instruction to access the V-Safe system.   [name] was instructed to call 911 with any severe reactions post vaccine: Marland Kitchen Difficulty breathing  . Swelling of face and throat  . A fast heartbeat  . A bad rash all over body  . Dizziness and weakness   Immunizations Administered    Name Date Dose VIS Date Route   Pfizer COVID-19 Vaccine 10/29/2019 10:07 AM 0.3 mL 08/31/2018 Intramuscular   Manufacturer: [employer], [employer]   Lot: [id number]   [place]: [id number]

## 2019-11-22 ENCOUNTER — Ambulatory Visit: Payer: Managed Care, Other (non HMO) | Attending: Internal Medicine

## 2019-11-22 DIAGNOSIS — Z23 Encounter for immunization: Secondary | ICD-10-CM

## 2019-11-22 NOTE — Progress Notes (Signed)
   Covid-19 Vaccination Clinic  Name:  Judy Robinson    MRN: [id number] DOB: [birth year]  11/22/2019  [name] was observed post Covid-19 immunization for 15 minutes without incident. She was provided with Vaccine Information Sheet and instruction to access the V-Safe system.   [name] was instructed to call 911 with any severe reactions post vaccine: Marland Kitchen Difficulty breathing  . Swelling of face and throat  . A fast heartbeat  . A bad rash all over body  . Dizziness and weakness   Immunizations Administered    Name Date Dose VIS Date Route   Pfizer COVID-19 Vaccine 11/22/2019  2:35 PM 0.3 mL 08/31/2018 Intramuscular   Manufacturer: [place]   Lot: [id number]   [place]: [id number]

## 2020-02-16 ENCOUNTER — Encounter: Payer: Self-pay | Admitting: Family Medicine

## 2020-02-16 ENCOUNTER — Other Ambulatory Visit: Payer: Self-pay

## 2020-02-16 ENCOUNTER — Ambulatory Visit (INDEPENDENT_AMBULATORY_CARE_PROVIDER_SITE_OTHER): Payer: Managed Care, Other (non HMO) | Admitting: Family Medicine

## 2020-02-16 ENCOUNTER — Encounter: Payer: Managed Care, Other (non HMO) | Admitting: Family Medicine

## 2020-02-16 VITALS — BP 124/76 | HR 88 | Temp 98.1°F | Resp 16 | Ht 62.5 in | Wt 255.3 lb

## 2020-02-16 DIAGNOSIS — Z1231 Encounter for screening mammogram for malignant neoplasm of breast: Secondary | ICD-10-CM

## 2020-02-16 DIAGNOSIS — Z1211 Encounter for screening for malignant neoplasm of colon: Secondary | ICD-10-CM

## 2020-02-16 DIAGNOSIS — Z124 Encounter for screening for malignant neoplasm of cervix: Secondary | ICD-10-CM

## 2020-02-16 DIAGNOSIS — Z0000 Encounter for general adult medical examination without abnormal findings: Secondary | ICD-10-CM

## 2020-02-16 NOTE — Progress Notes (Signed)
Patient: Judy Robinson, Female    DOB: Mar 21, 1971, 49 y.o.   MRN: [id number] Delsa Grana, PA-C Visit Date: 02/16/2020  Today's Provider: Delsa Grana, PA-C   Chief Complaint  Patient presents with  . Annual Exam   Subjective:   Annual physical exam:  Judy Robinson is a 49 y.o. female who presents today for complete physical exam:  Exercise/Activity:  Trying to walk up and down stairs at home - no other formal exercise or activity Diet/nutrition:  Cooks more at home, eating veggies at least 2 a night Sleep:  Up and down all night - 4-5 hours of sleep  No problem falling asleep, she is having trouble staying asleep   Wants form/paperwork done for work health screening  USPSTF grade A and B recommendations - reviewed and addressed today  Depression:  Phq 9 completed today by patient, was reviewed by me with patient in the room PHQ score is neg, pt feels mood is good PHQ 2/9 Scores 02/16/2020 02/14/2019 09/07/2018 04/14/2018  PHQ - 2 Score 0 0 0 0  PHQ- 9 Score 0 0 0 0   Depression screen [facility] 2/9 02/16/2020 02/14/2019 09/07/2018 04/14/2018 04/14/2018  Decreased Interest 0 0 0 0 0  Down, Depressed, Hopeless 0 0 0 0 0  PHQ - 2 Score 0 0 0 0 0  Altered sleeping 0 0 0 0 -  Tired, decreased energy 0 0 0 0 -  Change in appetite 0 0 0 0 -  Feeling bad or failure about yourself  0 0 0 0 -  Trouble concentrating 0 0 0 0 -  Moving slowly or fidgety/restless 0 0 0 0 -  Suicidal thoughts 0 0 0 0 -  PHQ-9 Score 0 0 0 0 -  Difficult doing work/chores Not difficult at all Not difficult at all Not difficult at all Not difficult at all -    Alcohol screening:   Office Visit from 02/16/2020 in [facility]  AUDIT-C Score 4     Wine - 1 glass most days of the week - averages out to probably one glass slightly less than daily   Immunizations and Health Maintenance: Health Maintenance  Topic Date Due  . MAMMOGRAM  11/28/2018  . COLONOSCOPY  03/26/2019  .  PAP SMEAR-Modifier  01/29/2020  . INFLUENZA VACCINE  02/05/2020  . TETANUS/TDAP  02/08/2025  . COVID-19 Vaccine  Completed  . Hepatitis C Screening  Completed  . HIV Screening  Completed     Hep C Screening: done   STD testing and prevention (HIV/chl/gon/syphilis):  see above, no additional testing desired by pt today*  Low risk 16 years married to husband  Intimate partner violence:  none  Sexual History/Pain during Intercourse: Married  Menstrual History/LMP/Abnormal Bleeding: none Patient's last menstrual period was 01/29/2020.  Still mentrating -  Regular cycle lasts 5-7 days, 2nd and 3rd and heaviest days of bleeding  - changing pads every 2 hours on heavy days   Incontinence Symptoms:  none  Breast cancer:   Due  Last Mammogram: *see HM list above BRCA gene screening: none Paternal grand aunt breast CA   Cervical cancer screening: due  Family hx of cancers - breast, ovarian, uterine, colon:    Maternal grandfather colon CA - 75's - diagnosed a while before he died   Osteoporosis:   Discussion on osteoporosis per age, including high calcium and vitamin D supplementation, weight bearing exercises    Skin cancer:  Hx of skin CA -  No    Discussed atypical lesions   Colorectal cancer:    Sees Dr. Allen Norris due Sept for 3 year f/up colonoscopy Discussed concerning signs and sx of CRC, pt denies melena, hematochezia  Lung cancer:   Low Dose CT Chest recommended if Age 63-80 years, 30 pack-year currently smoking OR have quit w/in 15years. Patient does not qualify.    Social History   Tobacco Use  . Smoking status: Former Smoker    Packs/day: 0.25    Years: 1.00    Pack years: 0.25    Quit date: 01/30/2011    Years since quitting: 9.0  . Smokeless tobacco: Never Used  Vaping Use  . Vaping Use: Never used  Substance Use Topics  . Alcohol use: Yes    Alcohol/week: 4.0 - 7.0 standard drinks    Types: 4 - 7 Standard drinks or equivalent per week    Comment:  ocassional  . Drug use: No       [facility] Visit from 02/16/2020 in [facility]  AUDIT-C Score 4      Family History  Problem Relation Age of Onset  . Hypertension Mother   . Thyroid disease Mother   . Diabetes Brother   . Heart attack Brother   . Heart disease Father 63       heart attack  . Breast cancer Paternal Aunt   . Asthma Maternal Grandmother   . Cancer Maternal Grandfather        prostate?  Marland Kitchen Heart attack Paternal Grandmother      Blood pressure/Hypertension: BP Readings from Last 3 Encounters:  02/16/20 124/76  02/14/19 130/78  09/07/18 128/76    Weight/Obesity: Wt Readings from Last 3 Encounters:  02/16/20 255 lb 4.8 oz (115.8 kg)  02/14/19 248 lb 6.4 oz (112.7 kg)  09/07/18 237 lb 3.2 oz (107.6 kg)   BMI Readings from Last 3 Encounters:  02/16/20 45.95 kg/m  02/14/19 42.64 kg/m  09/07/18 43.38 kg/m     Lipids:  Lab Results  Component Value Date   CHOL 182 02/14/2019   CHOL 168 02/03/2018   CHOL 183 01/28/2017   Lab Results  Component Value Date   HDL 46 02/14/2019   HDL 41 02/03/2018   HDL 44 01/28/2017   Lab Results  Component Value Date   LDLCALC 117 (H) 02/14/2019   LDLCALC 113 (H) 02/03/2018   LDLCALC 118 (H) 01/28/2017   Lab Results  Component Value Date   TRIG 95 02/14/2019   TRIG 71 02/03/2018   TRIG 103 01/28/2017   Lab Results  Component Value Date   CHOLHDL 4.0 02/14/2019   CHOLHDL 4.1 02/03/2018   CHOLHDL 4.2 01/28/2017   No results found for: LDLDIRECT Based on the results of lipid panel his/her cardiovascular risk factor ( using [facility] )  in the next 10 years is: The 10-year ASCVD risk score Mikey Bussing DC Brooke Bonito., et al., 2013) is: 2%   Values used to calculate the score:     Age: 25 years     Sex: Female     Is Non-Hispanic African American: Yes     Diabetic: No     Tobacco smoker: No     Systolic Blood Pressure: 893 mmHg     Is BP treated: No     HDL Cholesterol: 46 mg/dL     Total  Cholesterol: 182 mg/dL Glucose:  Glucose  Date Value Ref Range Status  02/14/2019 109 (H)  65 - 99 mg/dL Final  02/03/2018 91 65 - 99 mg/dL Final  01/28/2017 96 65 - 99 mg/dL Final   Hypertension: BP Readings from Last 3 Encounters:  02/16/20 124/76  02/14/19 130/78  09/07/18 128/76   Obesity: Wt Readings from Last 3 Encounters:  02/16/20 255 lb 4.8 oz (115.8 kg)  02/14/19 248 lb 6.4 oz (112.7 kg)  09/07/18 237 lb 3.2 oz (107.6 kg)   BMI Readings from Last 3 Encounters:  02/16/20 45.95 kg/m  02/14/19 42.64 kg/m  09/07/18 43.38 kg/m      Advanced Care Planning:  A voluntary discussion about advance care planning including the explanation and discussion of advance directives.   Discussed health care proxy and Living will, and the patient was able to identify a health care proxy as husband.   Patient does not have a living will at present time.  Given packet today, reviewed with her, encouraged her and husband to complete and return a copy to office  Social History      She        Social History   Socioeconomic History  . Marital status: Married    Spouse name: Not on file  . Number of children: Not on file  . Years of education: Not on file  . Highest education level: Not on file  Occupational History  . Not on file  Tobacco Use  . Smoking status: Former Smoker    Packs/day: 0.25    Years: 1.00    Pack years: 0.25    Quit date: 01/30/2011    Years since quitting: 9.0  . Smokeless tobacco: Never Used  Vaping Use  . Vaping Use: Never used  Substance and Sexual Activity  . Alcohol use: Yes    Alcohol/week: 4.0 - 7.0 standard drinks    Types: 4 - 7 Standard drinks or equivalent per week    Comment: ocassional  . Drug use: No  . Sexual activity: Yes    Partners: Male  Other Topics Concern  . Not on file  Social History Narrative  . Not on file   Social Determinants of Health   Financial Resource Strain:   . Difficulty of Paying Living Expenses:     Food Insecurity:   . Worried About Charity fundraiser in the Last Year:   . Arboriculturist in the Last Year:   Transportation Needs:   . Film/video editor (Medical):   Marland Kitchen Lack of Transportation (Non-Medical):   Physical Activity:   . Days of Exercise per Week:   . Minutes of Exercise per Session:   Stress:   . Feeling of Stress :   Social Connections:   . Frequency of Communication with Friends and Family:   . Frequency of Social Gatherings with Friends and Family:   . Attends Religious Services:   . Active Member of Clubs or Organizations:   . Attends Archivist Meetings:   Marland Kitchen Marital Status:     Family History        Family History  Problem Relation Age of Onset  . Hypertension Mother   . Thyroid disease Mother   . Diabetes Brother   . Heart attack Brother   . Heart disease Father 64       heart attack  . Breast cancer Paternal Aunt   . Asthma Maternal Grandmother   . Cancer Maternal Grandfather        prostate?  Marland Kitchen Heart attack Paternal Grandmother  Patient Active Problem List   Diagnosis Date Noted  . Benign neoplasm of sigmoid colon   . De Quervain's disease (radial styloid tenosynovitis) 02/09/2015  . Acne inversa 02/09/2015  . Obesity, Class III, BMI 40-49.9 (morbid obesity) ([place]) 02/09/2015    Past Surgical History:  Procedure Laterality Date  . CESAREAN SECTION    . COLONOSCOPY WITH PROPOFOL N/A 03/25/2016   Procedure: COLONOSCOPY WITH PROPOFOL;  Surgeon: Lucilla Lame, MD;  Location: [facility] ENDOSCOPY;  Service: Endoscopy;  Laterality: N/A;  . TUBAL LIGATION      No current outpatient medications on file.  No Known Allergies  Patient Care Team: Delsa Grana, PA-C as PCP - General (Family Medicine)  Review of Systems  10 Systems reviewed and are negative for acute change except as noted in the HPI.    I personally reviewed active problem list, medication list, allergies, family history, social history, health maintenance, notes  from last encounter, lab results, imaging with the patient/caregiver today.        Objective:   Vitals:  Vitals:   02/16/20 0943  BP: 124/76  Pulse: 88  Resp: 16  Temp: 98.1 F (36.7 C)  TempSrc: Oral  SpO2: 99%  Weight: 255 lb 4.8 oz (115.8 kg)  Height: 5' 2.5" (1.588 m)    Body mass index is 45.95 kg/m.  Physical Exam Vitals and nursing note reviewed.  Constitutional:      General: She is not in acute distress.    Appearance: Normal appearance. She is well-developed. She is not ill-appearing, toxic-appearing or diaphoretic.     Interventions: Face mask in place.  HENT:     Head: Normocephalic and atraumatic.     Right Ear: Tympanic membrane, ear canal and external ear normal.     Left Ear: Ear canal and external ear normal.     Nose: Congestion present.     Mouth/Throat:     Mouth: Mucous membranes are moist.     Pharynx: Oropharynx is clear. No oropharyngeal exudate or posterior oropharyngeal erythema.  Eyes:     General: Lids are normal. No scleral icterus.       Right eye: No discharge.        Left eye: No discharge.     Conjunctiva/sclera: Conjunctivae normal.     Pupils: Pupils are equal, round, and reactive to light.  Neck:     Thyroid: No thyroid mass, thyromegaly or thyroid tenderness.     Trachea: Phonation normal. No tracheal deviation.  Cardiovascular:     Rate and Rhythm: Normal rate and regular rhythm.     Pulses: Normal pulses.          Radial pulses are 2+ on the right side and 2+ on the left side.       Posterior tibial pulses are 2+ on the right side and 2+ on the left side.     Heart sounds: Normal heart sounds. No murmur heard.  No friction rub. No gallop.   Pulmonary:     Effort: Pulmonary effort is normal. No respiratory distress.     Breath sounds: Normal breath sounds. No stridor. No wheezing, rhonchi or rales.  Chest:     Chest wall: No tenderness.     Comments: Pt declined breast exam Abdominal:     General: Bowel sounds are  normal. There is no distension.     Palpations: Abdomen is soft.     Tenderness: There is no abdominal tenderness. There is no guarding or rebound.  Genitourinary:  Comments: Pt declined PAP/pelvic exam Musculoskeletal:     Right lower leg: No edema.     Left lower leg: No edema.  Skin:    General: Skin is warm and dry.     Coloration: Skin is not jaundiced or pale.     Findings: No rash.  Neurological:     Mental Status: She is alert. Mental status is at baseline.     Motor: No abnormal muscle tone.     Gait: Gait normal.  Psychiatric:        Mood and Affect: Mood normal.        Speech: Speech normal.        Behavior: Behavior normal.       Fall Risk: Fall Risk  02/16/2020 02/14/2019 09/07/2018 04/14/2018 03/09/2018  Falls in the past year? 0 0 0 No No  Number falls in past yr: 0 0 0 - -  Injury with Fall? 0 0 0 - -  Risk for fall due to : History of fall(s) - - - -    Functional Status Survey: Is the patient deaf or have difficulty hearing?: No Does the patient have difficulty concentrating, remembering, or making decisions?: No Does the patient have difficulty walking or climbing stairs?: No Does the patient have difficulty dressing or bathing?: No Does the patient have difficulty doing errands alone such as visiting a doctor's office or shopping?: No   Assessment & Plan:    CPE completed today  . USPSTF grade A and B recommendations reviewed with patient; age-appropriate recommendations, preventive care, screening tests, etc discussed and encouraged; healthy living encouraged; see AVS for patient education given to patient  . Discussed importance of 150 minutes of physical activity weekly, AHA exercise recommendations given to pt in AVS/handout  . Discussed importance of healthy diet:  eating lean meats and proteins, avoiding trans fats and saturated fats, avoid simple sugars and excessive carbs in diet, eat 6 servings of fruit/vegetables daily and drink plenty of  water and avoid sweet beverages.    . Recommended pt to do annual eye exam and routine dental exams/cleanings  . Depression, alcohol, fall screening completed as documented above and per flowsheets  . Reviewed Health Maintenance: Health Maintenance  Topic Date Due  . MAMMOGRAM  11/28/2018  . COLONOSCOPY  03/26/2019  . PAP SMEAR-Modifier  01/29/2020  . INFLUENZA VACCINE  02/05/2020  . TETANUS/TDAP  02/08/2025  . COVID-19 Vaccine  Completed  . Hepatitis C Screening  Completed  . HIV Screening  Completed    . Immunizations: Immunization History  Administered Date(s) Administered  . Influenza,inj,Quad PF,6+ Mos 09/07/2018  . PFIZER SARS-COV-2 Vaccination 10/29/2019, 11/22/2019  . Tdap 02/09/2015      ICD-10-CM   1. Preventative health care  Z00.00 Lipid panel    CBC w/Diff/Platelet    Hemoglobin A1C    Comprehensive metabolic panel    TSH + free T4  2. Encounter for screening mammogram for malignant neoplasm of breast  Z12.31 MM 3D SCREEN BREAST BILATERAL   mammogram ordered and pt given info to call and schedule  3. Obesity, Class III, BMI 40-49.9 (morbid obesity) ([facility])  E66.01 Lipid panel    Hemoglobin A1C    Comprehensive metabolic panel    TSH + free T4   discussed diet/exercise at length today - AHA handout given, recommended to decrease sedentary time, limit portions/calories  4. Screening for cervical cancer  Z12.4    Pt refused today, discussed screening/interval testing, offered to do today,  pt would like to wait until next year  5. Screen for colon cancer  Z12.11 Ambulatory referral to Gastroenterology   3 year f/up overdue to Dr. Araceli Bouche for pt for job health screening done today - pending labs    Delsa Grana, Hershal Coria 02/16/20 10:04 AM  [place]

## 2020-02-16 NOTE — Patient Instructions (Addendum)
Health Maintenance  Topic Date Due  . MAMMOGRAM  11/28/2018  . COLONOSCOPY  03/26/2019  . PAP SMEAR-Modifier  01/29/2020  . INFLUENZA VACCINE  02/05/2020  . TETANUS/TDAP  02/08/2025  . COVID-19 Vaccine  Completed  . Hepatitis C Screening  Completed  . HIV Screening  Completed    [facility] [place] [facility] [place],  [place]  [id number] Get Driving Directions Main: [phone]  American Heart Association [facility]) Exercise Recommendation  Being physically active is important to prevent heart disease and stroke, the nation's No. 1and No. 5killers. To improve overall cardiovascular health, we suggest at least 150 minutes per week of moderate exercise or 75 minutes per week of vigorous exercise (or a combination of moderate and vigorous activity). Thirty minutes a day, five times a week is an easy goal to remember. You will also experience benefits even if you divide your time into two or three segments of 10 to 15 minutes per day.  For people who would benefit from lowering their blood pressure or cholesterol, we recommend 40 minutes of aerobic exercise of moderate to vigorous intensity three to four times a week to lower the risk for heart attack and stroke.  Physical activity is anything that makes you move your body and burn calories.  This includes things like climbing stairs or playing sports. Aerobic exercises benefit your heart, and include walking, jogging, swimming or biking. Strength and stretching exercises are best for overall stamina and flexibility.  The simplest, positive change you can make to effectively improve your heart health is to start walking. It's enjoyable, free, easy, social and great exercise. A walking program is flexible and boasts high success rates because people can stick with it. It's easy for walking to become a regular and satisfying part of life.   For Overall Cardiovascular Health:  At least 30 minutes of  moderate-intensity aerobic activity at least 5 days per week for a total of 150  OR   At least 25 minutes of vigorous aerobic activity at least 3 days per week for a total of 75 minutes; or a combination of moderate- and vigorous-intensity aerobic activity  AND   Moderate- to high-intensity muscle-strengthening activity at least 2 days per week for additional health benefits.  For Lowering Blood Pressure and Cholesterol  An average 40 minutes of moderate- to vigorous-intensity aerobic activity 3 or 4 times per week  What if I can't make it to the time goal? Something is always better than nothing! And everyone has to start somewhere. Even if you've been sedentary for years, today is the day you can begin to make healthy changes in your life. If you don't think you'll make it for 30 or 40 minutes, set a reachable goal for today. You can work up toward your overall goal by increasing your time as you get stronger. Don't let all-or-nothing thinking rob you of doing what you can every day.  Source:http://www.heart.org      Preventing Osteoporosis, Adult Osteoporosis is a condition that causes the bones to lose density. This means that the bones become thinner, and the normal spaces in bone tissue become larger. Low bone density can make the bones weak and cause them to break more easily. Osteoporosis cannot always be prevented, but you can take steps to lower your risk of developing this condition. How can this condition affect me? If you develop osteoporosis, you will be more likely to break bones in your wrist, spine, or hip.  Even a minor accident or injury can be enough to break weak bones. The bones will also be slower to heal. Osteoporosis can cause other problems as well, such as a stooped posture or trouble with movement. Osteoporosis can occur with aging. As you get older, you may lose bone tissue more quickly, or it may be replaced more slowly. Osteoporosis is more likely to  develop if you have poor nutrition or do not get enough calcium or vitamin D. Other lifestyle factors can also play a role. By eating a well-balanced diet and making lifestyle changes, you can help keep your bones strong and healthy, lowering your chances of developing osteoporosis. What can increase my risk? The following factors may make you more likely to develop osteoporosis:  Having a family history of the condition.  Having poor nutrition or not getting enough calcium or vitamin D.  Using certain medicines, such as steroid medicines or antiseizure medicines.  Being any of the following: ? 36 years of age or older. ? Female. ? A woman who has gone through menopause (is postmenopausal). ? White (Caucasian) or of Asian descent.  Smoking or having a history of smoking.  Not being physically active (being sedentary).  Having a small body frame. What actions can I take to prevent this?  Get enough calcium   Make sure you get enough calcium every day. Calcium is the most important mineral for bone health. Most people can get enough calcium from their diet, but supplements may be recommended for people who are at risk for osteoporosis. Follow these guidelines: ? If you are age 55 or younger, aim to get 1,000 mg of calcium every day. ? If you are older than age 16, aim to get 1,200 mg of calcium every day.  Good sources of calcium include: ? Dairy products, such as low-fat or nonfat milk, cheese, and yogurt. ? Dark green leafy vegetables, such as bok choy and broccoli. ? Foods that have had calcium added to them (calcium-fortified foods), such as orange juice, cereal, bread, soy beverages, and tofu products. ? Nuts, such as almonds.  Check nutrition labels to see how much calcium is in a food or drink. Get enough vitamin D  Try to get enough vitamin D every day. Vitamin D is the most essential vitamin for bone health. It helps the body absorb calcium. Follow these guidelines for  how much vitamin D to get from food: ? If you are age 41 or younger, aim to get at least 600 international units (IU) every day. Your health care provider may suggest more. ? If you are older than age 71, aim to get at least 800 international units every day. Your health care provider may suggest more.  Good sources of vitamin D in your diet include: ? Egg yolks. ? Oily fish, such as salmon, sardines, and tuna. ? Milk and cereal fortified with vitamin D.  Your body also makes vitamin D when you are out in the sun. Exposing the bare skin on your face, arms, legs, or back to the sun for no more than 30 minutes a day, 2 times a week is more than enough. Beyond that, make sure you use sunblock to protect your skin from sunburn, which increases your risk for skin cancer. Exercise  Stay active and get exercise every day.  Ask your health care provider what types of exercise are best for you. Weight-bearing and strength-building activities are important for building and maintaining healthy bones. Some examples of these  types of activities include: ? Walking and hiking. ? Jogging and running. ? Dancing. ? Gym exercises. ? Lifting weights. ? Tennis and racquetball. ? Climbing stairs. ? Aerobics. Make other lifestyle changes  Do not use any products that contain nicotine or tobacco, such as cigarettes, e-cigarettes, and chewing tobacco. If you need help quitting, ask your health care provider.  Lose weight if you are overweight.  If you drink alcohol: ? Limit how much you use to:  0-1 drink a day for nonpregnant women.  0-2 drinks a day for men. ? Be aware of how much alcohol is in your drink. In the [place] one drink equals one 12 oz bottle of beer (355 mL), one 5 oz glass of wine (148 mL), or one 1 oz glass of hard liquor (44 mL). Where to find support If you need help making changes to prevent osteoporosis, talk with your health care provider. You can ask for a referral to a diet and  nutrition specialist (dietitian) and a physical therapist. Where to find more information Learn more about osteoporosis from:  NIH Osteoporosis and Related [place]: www.bones.SouthExposed.es  U.S. Office on [employer] Health: [place]Signs.tn  [place]: EquipmentWeekly.com.ee Summary  Osteoporosis is a condition that causes weak bones that are more likely to break.  Eat a healthy diet, making sure you get enough calcium and vitamin D, and stay active by getting regular exercise to help prevent osteoporosis.  Other ways to reduce your risk of osteoporosis include maintaining a healthy weight and avoiding alcohol and products that contain nicotine or tobacco. This information is not intended to replace advice given to you by your health care provider. Make sure you discuss any questions you have with your health care provider. Document Revised: 01/21/2019 Document Reviewed: 01/21/2019 Elsevier Patient Education  2020 Elsevier Inc.   Preventive Care 13-58 Years Old, Female Preventive care refers to visits with your health care provider and lifestyle choices that can promote health and wellness. This includes:  A yearly physical exam. This may also be called an annual well check.  Regular dental visits and eye exams.  Immunizations.  Screening for certain conditions.  Healthy lifestyle choices, such as eating a healthy diet, getting regular exercise, not using drugs or products that contain nicotine and tobacco, and limiting alcohol use. What can I expect for my preventive care visit? Physical exam Your health care provider will check your:  Height and weight. This may be used to calculate body mass index (BMI), which tells if you are at a healthy weight.  Heart rate and blood pressure.  Skin for abnormal spots. Counseling Your health care provider may ask you questions about your:  Alcohol, tobacco, and drug use.  Emotional  well-being.  Home and relationship well-being.  Sexual activity.  Eating habits.  Work and work Statistician.  Method of birth control.  Menstrual cycle.  Pregnancy history. What immunizations do I need?  Influenza (flu) vaccine  This is recommended every year. Tetanus, diphtheria, and pertussis (Tdap) vaccine  You may need a Td booster every 10 years. Varicella (chickenpox) vaccine  You may need this if you have not been vaccinated. Zoster (shingles) vaccine  You may need this after age 35. Measles, mumps, and rubella (MMR) vaccine  You may need at least one dose of MMR if you were born in 1957 or later. You may also need a second dose. Pneumococcal conjugate (PCV13) vaccine  You may need this if you have certain conditions  and were not previously vaccinated. Pneumococcal polysaccharide (PPSV23) vaccine  You may need one or two doses if you smoke cigarettes or if you have certain conditions. Meningococcal conjugate (MenACWY) vaccine  You may need this if you have certain conditions. Hepatitis A vaccine  You may need this if you have certain conditions or if you travel or work in places where you may be exposed to hepatitis A. Hepatitis B vaccine  You may need this if you have certain conditions or if you travel or work in places where you may be exposed to hepatitis B. Haemophilus influenzae type b (Hib) vaccine  You may need this if you have certain conditions. Human papillomavirus (HPV) vaccine  If recommended by your health care provider, you may need three doses over 6 months. You may receive vaccines as individual doses or as more than one vaccine together in one shot (combination vaccines). Talk with your health care provider about the risks and benefits of combination vaccines. What tests do I need? Blood tests  Lipid and cholesterol levels. These may be checked every 5 years, or more frequently if you are over 49 years old.  Hepatitis C  test.  Hepatitis B test. Screening  Lung cancer screening. You may have this screening every year starting at age 25 if you have a 30-pack-year history of smoking and currently smoke or have quit within the past 15 years.  Colorectal cancer screening. All adults should have this screening starting at age 4 and continuing until age 34. Your health care provider may recommend screening at age 27 if you are at increased risk. You will have tests every 1-10 years, depending on your results and the type of screening test.  Diabetes screening. This is done by checking your blood sugar (glucose) after you have not eaten for a while (fasting). You may have this done every 1-3 years.  Mammogram. This may be done every 1-2 years. Talk with your health care provider about when you should start having regular mammograms. This may depend on whether you have a family history of breast cancer.  BRCA-related cancer screening. This may be done if you have a family history of breast, ovarian, tubal, or peritoneal cancers.  Pelvic exam and Pap test. This may be done every 3 years starting at age 68. Starting at age 66, this may be done every 5 years if you have a Pap test in combination with an HPV test. Other tests  Sexually transmitted disease (STD) testing.  Bone density scan. This is done to screen for osteoporosis. You may have this scan if you are at high risk for osteoporosis. Follow these instructions at home: Eating and drinking  Eat a diet that includes fresh fruits and vegetables, whole grains, lean protein, and low-fat dairy.  Take vitamin and mineral supplements as recommended by your health care provider.  Do not drink alcohol if: ? Your health care provider tells you not to drink. ? You are pregnant, may be pregnant, or are planning to become pregnant.  If you drink alcohol: ? Limit how much you have to 0-1 drink a day. ? Be aware of how much alcohol is in your drink. In the U.S., one  drink equals one 12 oz bottle of beer (355 mL), one 5 oz glass of wine (148 mL), or one 1 oz glass of hard liquor (44 mL). Lifestyle  Take daily care of your teeth and gums.  Stay active. Exercise for at least 30 minutes on 5 or  more days each week.  Do not use any products that contain nicotine or tobacco, such as cigarettes, e-cigarettes, and chewing tobacco. If you need help quitting, ask your health care provider.  If you are sexually active, practice safe sex. Use a condom or other form of birth control (contraception) in order to prevent pregnancy and STIs (sexually transmitted infections).  If told by your health care provider, take low-dose aspirin daily starting at age 40. What's next?  Visit your health care provider once a year for a well check visit.  Ask your health care provider how often you should have your eyes and teeth checked.  Stay up to date on all vaccines. This information is not intended to replace advice given to you by your health care provider. Make sure you discuss any questions you have with your health care provider. Document Revised: 03/04/2018 Document Reviewed: 03/04/2018 Elsevier Patient Education  2020 [employer].

## 2020-02-18 LAB — COMPREHENSIVE METABOLIC PANEL
ALT: 13 IU/L (ref 0–32)
AST: 14 IU/L (ref 0–40)
Albumin/Globulin Ratio: 1.3 (ref 1.2–2.2)
Albumin: 4.1 g/dL (ref 3.8–4.8)
Alkaline Phosphatase: 85 IU/L (ref 48–121)
BUN/Creatinine Ratio: 20 (ref 9–23)
BUN: 15 mg/dL (ref 6–24)
Bilirubin Total: 0.3 mg/dL (ref 0.0–1.2)
CO2: 24 mmol/L (ref 20–29)
Calcium: 9 mg/dL (ref 8.7–10.2)
Chloride: 104 mmol/L (ref 96–106)
Creatinine, Ser: 0.76 mg/dL (ref 0.57–1.00)
GFR calc Af Amer: 106 mL/min/{1.73_m2} (ref >59)
GFR calc non Af Amer: 92 mL/min/{1.73_m2} (ref >59)
Globulin, Total: 3.1 g/dL (ref 1.5–4.5)
Glucose: 93 mg/dL (ref 65–99)
Potassium: 4.2 mmol/L (ref 3.5–5.2)
Sodium: 139 mmol/L (ref 134–144)
Total Protein: 7.2 g/dL (ref 6.0–8.5)

## 2020-02-18 LAB — HEMOGLOBIN A1C
Est. average glucose Bld gHb Est-mCnc: 114 mg/dL
Hgb A1c MFr Bld: 5.6 % (ref 4.8–5.6)

## 2020-02-18 LAB — CBC WITH DIFFERENTIAL/PLATELET
Basophils Absolute: 0.1 10*3/uL (ref 0.0–0.2)
Basos: 1 %
EOS (ABSOLUTE): 0.4 10*3/uL (ref 0.0–0.4)
Eos: 6 %
Hematocrit: 37.3 % (ref 34.0–46.6)
Hemoglobin: 11.6 g/dL (ref 11.1–15.9)
Immature Grans (Abs): 0 10*3/uL (ref 0.0–0.1)
Immature Granulocytes: 0 %
Lymphocytes Absolute: 2.5 10*3/uL (ref 0.7–3.1)
Lymphs: 32 %
MCH: 27 pg (ref 26.6–33.0)
MCHC: 31.1 g/dL — ABNORMAL LOW (ref 31.5–35.7)
MCV: 87 fL (ref 79–97)
Monocytes Absolute: 0.6 10*3/uL (ref 0.1–0.9)
Monocytes: 8 %
Neutrophils Absolute: 4.1 10*3/uL (ref 1.4–7.0)
Neutrophils: 53 %
Platelets: 257 10*3/uL (ref 150–450)
RBC: 4.29 x10E6/uL (ref 3.77–5.28)
RDW: 14.2 % (ref 11.7–15.4)
WBC: 7.7 10*3/uL (ref 3.4–10.8)

## 2020-02-18 LAB — TSH+FREE T4
Free T4: 0.86 ng/dL (ref 0.82–1.77)
TSH: 1.12 u[IU]/mL (ref 0.450–4.500)

## 2020-02-18 LAB — LIPID PANEL
Chol/HDL Ratio: 4.4 ratio (ref 0.0–4.4)
Cholesterol, Total: 183 mg/dL (ref 100–199)
HDL: 42 mg/dL (ref >39)
LDL Chol Calc (NIH): 120 mg/dL — ABNORMAL HIGH (ref 0–99)
Triglycerides: 116 mg/dL (ref 0–149)
VLDL Cholesterol Cal: 21 mg/dL (ref 5–40)

## 2020-02-23 ENCOUNTER — Telehealth (INDEPENDENT_AMBULATORY_CARE_PROVIDER_SITE_OTHER): Payer: Self-pay | Admitting: Gastroenterology

## 2020-02-23 ENCOUNTER — Other Ambulatory Visit: Payer: Self-pay

## 2020-02-23 DIAGNOSIS — Z86010 Personal history of colonic polyps: Secondary | ICD-10-CM

## 2020-02-23 NOTE — Progress Notes (Signed)
Gastroenterology Pre-Procedure Review  Request Date: Thursday 05/03/20 Requesting Physician: Dr. Allen Norris  PATIENT REVIEW QUESTIONS: The patient responded to the following health history questions as indicated:    1. Are you having any GI issues? no 2. Do you have a personal history of Polyps? yes (colon polyps were noted on Sept 2017 colonoscopy performed by Dr. Allen Norris) 3. Do you have a family history of Colon Cancer or Polyps? yes (maternal grandfather had colo cancer) 4. Diabetes Mellitus? no 5. Joint replacements in the past 12 months?no 6. Major health problems in the past 3 months?no 7. Any artificial heart valves, MVP, or defibrillator?no    MEDICATIONS & ALLERGIES:    Patient reports the following regarding taking any anticoagulation/antiplatelet therapy:   Plavix, Coumadin, Eliquis, Xarelto, Lovenox, Pradaxa, Brilinta, or Effient? no Aspirin? no  Patient confirms/reports the following medications:  No current outpatient medications on file.   No current facility-administered medications for this visit.    Patient confirms/reports the following allergies:  No Known Allergies  No orders of the defined types were placed in this encounter.   AUTHORIZATION INFORMATION Primary Insurance: 1D#: Group #:  Secondary Insurance: 1D#: Group #:  SCHEDULE INFORMATION: Date: 05/03/20 Time: Location:[facility]

## 2020-04-19 ENCOUNTER — Ambulatory Visit
Admission: RE | Admit: 2020-04-19 | Discharge: 2020-04-19 | Disposition: A | Discharge status: Home / Self Care | Payer: Managed Care, Other (non HMO) | Source: Ambulatory Visit | Attending: Family Medicine | Admitting: Family Medicine

## 2020-04-19 ENCOUNTER — Other Ambulatory Visit: Payer: Self-pay

## 2020-04-19 DIAGNOSIS — Z1231 Encounter for screening mammogram for malignant neoplasm of breast: Secondary | ICD-10-CM | POA: Insufficient documentation

## 2020-05-01 ENCOUNTER — Other Ambulatory Visit: Payer: Self-pay

## 2020-05-01 ENCOUNTER — Telehealth: Payer: Self-pay | Admitting: Gastroenterology

## 2020-05-01 ENCOUNTER — Other Ambulatory Visit
Admission: RE | Admit: 2020-05-01 | Discharge: 2020-05-01 | Disposition: A | Discharge status: Home / Self Care | Payer: Managed Care, Other (non HMO) | Source: Ambulatory Visit | Attending: Gastroenterology | Admitting: Gastroenterology

## 2020-05-01 DIAGNOSIS — Z20822 Contact with and (suspected) exposure to covid-19: Secondary | ICD-10-CM | POA: Diagnosis not present

## 2020-05-01 DIAGNOSIS — Z01812 Encounter for preprocedural laboratory examination: Secondary | ICD-10-CM | POA: Diagnosis not present

## 2020-05-01 LAB — SARS CORONAVIRUS 2 (TAT 6-24 HRS): SARS Coronavirus 2: NEGATIVE

## 2020-05-01 MED ORDER — GOLYTELY 236 G PO SOLR: 4000.0000 mL | Freq: Once | ORAL | 0 refills | Status: AC

## 2020-05-01 NOTE — Telephone Encounter (Signed)
Patient states suprep was too expensive and would like another prep called into [place]. Pt is scheduled with Dr. Allen Norris this Wed 10.28.21 for colon. Pt had screening call on 8.19.21.

## 2020-05-03 ENCOUNTER — Ambulatory Visit: Payer: Managed Care, Other (non HMO) | Admitting: Anesthesiology

## 2020-05-03 ENCOUNTER — Encounter: Payer: Self-pay | Admitting: Gastroenterology

## 2020-05-03 ENCOUNTER — Encounter
Admission: RE | Disposition: A | Discharge status: Home / Self Care | Payer: Self-pay | Source: Home / Self Care | Attending: Gastroenterology

## 2020-05-03 ENCOUNTER — Ambulatory Visit
Admission: RE | Admit: 2020-05-03 | Discharge: 2020-05-03 | Disposition: A | Discharge status: Home / Self Care | Payer: Managed Care, Other (non HMO) | Attending: Gastroenterology | Admitting: Gastroenterology

## 2020-05-03 ENCOUNTER — Other Ambulatory Visit: Payer: Self-pay

## 2020-05-03 DIAGNOSIS — K5730 Diverticulosis of large intestine without perforation or abscess without bleeding: Secondary | ICD-10-CM | POA: Diagnosis not present

## 2020-05-03 DIAGNOSIS — K648 Other hemorrhoids: Secondary | ICD-10-CM | POA: Insufficient documentation

## 2020-05-03 DIAGNOSIS — Z860100 Personal history of colon polyps, unspecified: Secondary | ICD-10-CM

## 2020-05-03 DIAGNOSIS — Z6841 Body Mass Index (BMI) 40.0 and over, adult: Secondary | ICD-10-CM | POA: Insufficient documentation

## 2020-05-03 DIAGNOSIS — Z87891 Personal history of nicotine dependence: Secondary | ICD-10-CM | POA: Insufficient documentation

## 2020-05-03 DIAGNOSIS — Z1211 Encounter for screening for malignant neoplasm of colon: Secondary | ICD-10-CM | POA: Insufficient documentation

## 2020-05-03 HISTORY — PX: COLONOSCOPY WITH PROPOFOL: SHX5780

## 2020-05-03 LAB — POCT PREGNANCY, URINE: Preg Test, Ur: NEGATIVE

## 2020-05-03 SURGERY — COLONOSCOPY WITH PROPOFOL
Anesthesia: General

## 2020-05-03 MED ORDER — PROPOFOL 500 MG/50ML IV EMUL
INTRAVENOUS | Status: AC
  Filled 2020-05-03: qty 50

## 2020-05-03 MED ORDER — PROPOFOL 10 MG/ML IV BOLUS
INTRAVENOUS | Status: DC | PRN
  Administered 2020-05-03: 50 mg via INTRAVENOUS

## 2020-05-03 MED ORDER — SODIUM CHLORIDE 0.9 % IV SOLN: INTRAVENOUS | Status: DC

## 2020-05-03 MED ORDER — PROPOFOL 500 MG/50ML IV EMUL
INTRAVENOUS | Status: DC | PRN
  Administered 2020-05-03: 120 ug/kg/min via INTRAVENOUS

## 2020-05-03 NOTE — H&P (Signed)
Lucilla Lame, MD [address] [place], [place] [address] [phone] Fax : [phone]  Primary Care Physician:  Delsa Grana, PA-C Primary Gastroenterologist:  Dr. Allen Norris  Pre-Procedure History & Physical: HPI:  Judy Robinson is a 49 y.o. female is here for an colonoscopy.   Past Medical History:  Diagnosis Date  . Essential hypertension, benign 02/03/2018  . Obesity, Class III, BMI 40-49.9 (morbid obesity) ([place]) 02/09/2015  . Torn meniscus     Past Surgical History:  Procedure Laterality Date  . CESAREAN SECTION    . COLONOSCOPY WITH PROPOFOL N/A 03/25/2016   Procedure: COLONOSCOPY WITH PROPOFOL;  Surgeon: Lucilla Lame, MD;  Location: [facility] ENDOSCOPY;  Service: Endoscopy;  Laterality: N/A;  . TUBAL LIGATION      Prior to Admission medications   Not on File    Allergies as of 02/23/2020  . (No Known Allergies)    Family History  Problem Relation Age of Onset  . Hypertension Mother   . Thyroid disease Mother   . Diabetes Brother   . Heart attack Brother   . Heart disease Father 21       heart attack  . Breast cancer Paternal Aunt   . Asthma Maternal Grandmother   . Cancer Maternal Grandfather        prostate?  Marland Kitchen Heart attack Paternal Grandmother     Social History   Socioeconomic History  . Marital status: Married    Spouse name: Not on file  . Number of children: Not on file  . Years of education: Not on file  . Highest education level: Not on file  Occupational History  . Not on file  Tobacco Use  . Smoking status: Former Smoker    Packs/day: 0.25    Years: 1.00    Pack years: 0.25    Quit date: 01/30/2011    Years since quitting: 9.2  . Smokeless tobacco: Never Used  Vaping Use  . Vaping Use: Never used  Substance and Sexual Activity  . Alcohol use: Yes    Alcohol/week: 4.0 - 7.0 standard drinks    Types: 4 - 7 Standard drinks or equivalent per week    Comment: ocassional  . Drug use: No  . Sexual activity: Yes     Partners: Male  Other Topics Concern  . Not on file  Social History Narrative  . Not on file   Social Determinants of Health   Financial Resource Strain:   . Difficulty of Paying Living Expenses: Not on file  Food Insecurity:   . Worried About Charity fundraiser in the Last Year: Not on file  . Ran Out of Food in the Last Year: Not on file  Transportation Needs:   . Lack of Transportation (Medical): Not on file  . Lack of Transportation (Non-Medical): Not on file  Physical Activity:   . Days of Exercise per Week: Not on file  . Minutes of Exercise per Session: Not on file  Stress:   . Feeling of Stress : Not on file  Social Connections:   . Frequency of Communication with Friends and Family: Not on file  . Frequency of Social Gatherings with Friends and Family: Not on file  . Attends Religious Services: Not on file  . Active Member of Clubs or Organizations: Not on file  . Attends Archivist Meetings: Not on file  . Marital Status: Not on file  Intimate Partner Violence:   . Fear of Current  or Ex-Partner: Not on file  . Emotionally Abused: Not on file  . Physically Abused: Not on file  . Sexually Abused: Not on file    Review of Systems: See HPI, otherwise negative ROS  Physical Exam: BP (!) 169/89   Pulse 71   Temp 97.6 F (36.4 C) (Temporal)   Resp 20   Ht 5\' 2"  (1.575 m)   Wt 115.7 kg   LMP 04/11/2020   SpO2 100%   BMI 46.64 kg/m  General:   Alert,  pleasant and cooperative in NAD Head:  Normocephalic and atraumatic. Neck:  Supple; no masses or thyromegaly. Lungs:  Clear throughout to auscultation.    Heart:  Regular rate and rhythm. Abdomen:  Soft, nontender and nondistended. Normal bowel sounds, without guarding, and without rebound.   Neurologic:  Alert and  oriented x4;  grossly normal neurologically.  Impression/Plan: Judy Robinson is here for an colonoscopy to be performed for a history of adenomatous polyps on 01/2016   Risks,  benefits, limitations, and alternatives regarding  colonoscopy have been reviewed with the patient.  Questions have been answered.  All parties agreeable.   Lucilla Lame, MD  05/03/2020, 7:37 AM

## 2020-05-03 NOTE — Anesthesia Preprocedure Evaluation (Signed)
Anesthesia Evaluation  Patient identified by MRN, date of birth, ID band Patient awake    Reviewed: Allergy & Precautions, NPO status , Patient's Chart, lab work & pertinent test results  History of Anesthesia Complications Negative for: history of anesthetic complications  Airway Mallampati: II  TM Distance: >3 FB Neck ROM: Full    Dental no notable dental hx. (+) Teeth Intact   Pulmonary neg pulmonary ROS, neg sleep apnea, neg COPD, Patient abstained from smoking.Not current smoker, former smoker,    Pulmonary exam normal breath sounds clear to auscultation       Cardiovascular Exercise Tolerance: Good METShypertension, (-) CAD and (-) Past MI negative cardio ROS  (-) dysrhythmias  Rhythm:Regular Rate:Normal - Systolic murmurs    Neuro/Psych negative neurological ROS  negative psych ROS   GI/Hepatic neg GERD  ,(+)     (-) substance abuse  ,   Endo/Other  neg diabetesMorbid obesity  Renal/GU negative Renal ROS     Musculoskeletal   Abdominal (+) + obese,   Peds  Hematology   Anesthesia Other Findings Past Medical History: 02/03/2018: Essential hypertension, benign 02/09/2015: Obesity, Class III, BMI 40-49.9 (morbid obesity) ([place]) No date: Torn meniscus  Reproductive/Obstetrics                             Anesthesia Physical Anesthesia Plan  ASA: III  Anesthesia Plan: General   Post-op Pain Management:    Induction: Intravenous  PONV Risk Score and Plan: 3 and Ondansetron, Propofol infusion and TIVA  Airway Management Planned: Nasal Cannula  Additional Equipment: None  Intra-op Plan:   Post-operative Plan:   Informed Consent: I have reviewed the patients History and Physical, chart, labs and discussed the procedure including the risks, benefits and alternatives for the proposed anesthesia with the patient or authorized representative who has indicated his/her understanding  and acceptance.     Dental advisory given  Plan Discussed with: CRNA and Surgeon  Anesthesia Plan Comments: (Discussed risks of anesthesia with patient, including possibility of difficulty with spontaneous ventilation under anesthesia necessitating airway intervention, PONV, and rare risks such as cardiac or respiratory or neurological events. Patient understands.)        Anesthesia Quick Evaluation

## 2020-05-03 NOTE — Op Note (Signed)
[facility] Patient Name: Judy Robinson Procedure Date: 05/03/2020 7:54 AM MRN: [id number] Account #: [id number] Date of Birth: 1970-10-24 Admit Type: Outpatient Age: 49 Room: [facility] ENDO ROOM 4 Gender: Female Note Status: Finalized Procedure:             Colonoscopy Indications:           High risk colon cancer surveillance: Personal history                         of colonic polyps Providers:             Lucilla Lame MD, MD Referring MD:          Delsa Grana (Referring MD) Medicines:             Propofol per Anesthesia Complications:         No immediate complications. Procedure:             Pre-Anesthesia Assessment:                        - Prior to the procedure, a History and Physical was                         performed, and patient medications and allergies were                         reviewed. The patient's tolerance of previous                         anesthesia was also reviewed. The risks and benefits                         of the procedure and the sedation options and risks                         were discussed with the patient. All questions were                         answered, and informed consent was obtained. Prior                         Anticoagulants: The patient has taken no previous                         anticoagulant or antiplatelet agents. ASA Grade                         Assessment: II - A patient with mild systemic disease.                         After reviewing the risks and benefits, the patient                         was deemed in satisfactory condition to undergo the                         procedure.  After obtaining informed consent, the colonoscope was                         passed under direct vision. Throughout the procedure,                         the patient's blood pressure, pulse, and oxygen                         saturations were monitored continuously. The                          Colonoscope was introduced through the anus and                         advanced to the the cecum, identified by appendiceal                         orifice and ileocecal valve. The colonoscopy was                         performed without difficulty. The patient tolerated                         the procedure well. The quality of the bowel                         preparation was excellent. Findings:      The perianal and digital rectal examinations were normal.      A few small-mouthed diverticula were found in the entire colon.      Non-bleeding internal hemorrhoids were found during retroflexion. The       hemorrhoids were Grade I (internal hemorrhoids that do not prolapse). Impression:            - Diverticulosis in the entire examined colon.                        - Non-bleeding internal hemorrhoids.                        - No specimens collected. Recommendation:        - Discharge patient to home.                        - Resume previous diet.                        - Continue present medications.                        - Repeat colonoscopy in 7 years for surveillance. Procedure Code(s):     --- Professional ---                        [phone], Colonoscopy, flexible; diagnostic, including                         collection of specimen(s) by brushing or washing, when  performed (separate procedure) Diagnosis Code(s):     --- Professional ---                        Z86.010, Personal history of colonic polyps CPT copyright 2019 American Medical Association. All rights reserved. The codes documented in this report are preliminary and upon coder review may  be revised to meet current compliance requirements. Lucilla Lame MD, MD 05/03/2020 8:15:13 AM This report has been signed electronically. Number of Addenda: 0 Note Initiated On: 05/03/2020 7:54 AM Scope Withdrawal Time: 0 hours 6 minutes 9 seconds  Total Procedure Duration: 0 hours 12 minutes 9  seconds  Estimated Blood Loss:  Estimated blood loss: none.      [facility]

## 2020-05-03 NOTE — Transfer of Care (Signed)
Immediate Anesthesia Transfer of Care Note  Patient: Judy Robinson  Procedure(s) Performed: COLONOSCOPY WITH PROPOFOL (N/A )  Patient Location: PACU and Endoscopy Unit  Anesthesia Type:MAC and General  Level of Consciousness: drowsy  Airway & Oxygen Therapy: Patient Spontanous Breathing  Post-op Assessment: Report given to RN  Post vital signs: stable  Last Vitals:  Vitals Value Taken Time  BP    Temp    Pulse    Resp    SpO2      Last Pain:  Vitals:   05/03/20 0729  TempSrc: Temporal  PainSc: 0-No pain         Complications: No complications documented.

## 2020-05-03 NOTE — Anesthesia Postprocedure Evaluation (Signed)
Anesthesia Post Note  Patient: Judy Robinson  Procedure(s) Performed: COLONOSCOPY WITH PROPOFOL (N/A )  Patient location during evaluation: Endoscopy Anesthesia Type: General Level of consciousness: awake and alert Pain management: pain level controlled Vital Signs Assessment: post-procedure vital signs reviewed and stable Respiratory status: spontaneous breathing, nonlabored ventilation, respiratory function stable and patient connected to nasal cannula oxygen Cardiovascular status: blood pressure returned to baseline and stable Postop Assessment: no apparent nausea or vomiting Anesthetic complications: no   No complications documented.   Last Vitals:  Vitals:   05/03/20 0818 05/03/20 0828  BP: (!) 111/56 122/66  Pulse: 81 66  Resp: 18 17  Temp: (!) 36.3 C   SpO2: 98% 97%    Last Pain:  Vitals:   05/03/20 0828  TempSrc:   PainSc: 0-No pain                 Arita Miss

## 2020-05-07 ENCOUNTER — Encounter: Payer: Self-pay | Admitting: Gastroenterology

## 2021-02-18 ENCOUNTER — Encounter: Payer: Managed Care, Other (non HMO) | Admitting: Family Medicine

## 2021-03-29 ENCOUNTER — Other Ambulatory Visit (HOSPITAL_COMMUNITY)
Admission: RE | Admit: 2021-03-29 | Discharge: 2021-03-29 | Disposition: A | Discharge status: Home / Self Care | Payer: Managed Care, Other (non HMO) | Source: Ambulatory Visit | Attending: Family Medicine | Admitting: Family Medicine

## 2021-03-29 ENCOUNTER — Other Ambulatory Visit: Payer: Self-pay

## 2021-03-29 ENCOUNTER — Ambulatory Visit (INDEPENDENT_AMBULATORY_CARE_PROVIDER_SITE_OTHER): Payer: Managed Care, Other (non HMO) | Admitting: Family Medicine

## 2021-03-29 ENCOUNTER — Encounter: Payer: Self-pay | Admitting: Family Medicine

## 2021-03-29 VITALS — BP 166/102 | HR 84 | Temp 98.4°F | Resp 16 | Ht 63.0 in | Wt 253.3 lb

## 2021-03-29 DIAGNOSIS — Z8349 Family history of other endocrine, nutritional and metabolic diseases: Secondary | ICD-10-CM

## 2021-03-29 DIAGNOSIS — Z124 Encounter for screening for malignant neoplasm of cervix: Secondary | ICD-10-CM

## 2021-03-29 DIAGNOSIS — Z1231 Encounter for screening mammogram for malignant neoplasm of breast: Secondary | ICD-10-CM | POA: Diagnosis not present

## 2021-03-29 DIAGNOSIS — Z0000 Encounter for general adult medical examination without abnormal findings: Secondary | ICD-10-CM

## 2021-03-29 DIAGNOSIS — E66813 Obesity, class 3: Secondary | ICD-10-CM

## 2021-03-29 DIAGNOSIS — Z8249 Family history of ischemic heart disease and other diseases of the circulatory system: Secondary | ICD-10-CM

## 2021-03-29 DIAGNOSIS — I10 Essential (primary) hypertension: Secondary | ICD-10-CM

## 2021-03-29 DIAGNOSIS — E785 Hyperlipidemia, unspecified: Secondary | ICD-10-CM

## 2021-03-29 DIAGNOSIS — Z23 Encounter for immunization: Secondary | ICD-10-CM

## 2021-03-29 NOTE — Progress Notes (Signed)
Patient: Judy Robinson, Female    DOB: 04/07/1971, 50 y.o.   MRN: [id number] Delsa Grana, PA-C Visit Date: 03/29/2021  Today's Provider: Delsa Grana, PA-C   Chief Complaint  Patient presents with   Annual Exam   Subjective:   Annual physical exam:  Judy Robinson is a 50 y.o. female who presents today for complete physical exam:  Exercise/Activity:  still working from home more sedentary than last year  Diet/nutrition:  Cooks more at home, eating veggies at least 2 a night Sleep:  Up and down all night - 4-5 hours of sleep  No problem falling asleep, she is having trouble staying asleep  No prior sleep eval   SDOH Screenings   Alcohol Screen: Not on file  Depression (PHQ2-9): Low Risk    PHQ-2 Score: 0  Financial Resource Strain: Low Risk    Difficulty of Paying Living Expenses: Not hard at all  Food Insecurity: No Food Insecurity   Worried About Charity fundraiser in the Last Year: Never true   [place] in the Last Year: Never true  Housing: Not on file  Physical Activity: Inactive   Days of Exercise per Week: 0 days   Minutes of Exercise per Session: 0 min  Social Connections: Moderately Isolated   Frequency of Communication with Friends and Family: Twice a week   Frequency of Social Gatherings with Friends and Family: Once a week   Attends Religious Services: Never   Marine scientist or Organizations: No   Attends Archivist Meetings: Never   Marital Status: Married  Stress: No Stress Concern Present   Feeling of Stress : Only a little  Tobacco Use: Medium Risk   Smoking Tobacco Use: Former   Smokeless Tobacco Use: Never  Transportation Needs: No Data processing manager (Medical): No   Lack of Transportation (Non-Medical): No     USPSTF grade A and B recommendations - reviewed and addressed today  Depression:  Phq 9 completed today by patient, was reviewed by me with patient in the room PHQ  score is neg, pt feels good PHQ 2/9 Scores 03/29/2021 02/16/2020 02/14/2019 09/07/2018  PHQ - 2 Score 0 0 0 0  PHQ- 9 Score 0 0 0 0   Depression screen [facility] 2/9 03/29/2021 02/16/2020 02/14/2019 09/07/2018 04/14/2018  Decreased Interest 0 0 0 0 0  Down, Depressed, Hopeless 0 0 0 0 0  PHQ - 2 Score 0 0 0 0 0  Altered sleeping 0 0 0 0 0  Tired, decreased energy 0 0 0 0 0  Change in appetite 0 0 0 0 0  Feeling bad or failure about yourself  0 0 0 0 0  Trouble concentrating 0 0 0 0 0  Moving slowly or fidgety/restless 0 0 0 0 0  Suicidal thoughts 0 0 0 0 0  PHQ-9 Score 0 0 0 0 0  Difficult doing work/chores Not difficult at all Not difficult at all Not difficult at all Not difficult at all Not difficult at all    Alcohol screening: [place] Office Visit from 02/16/2020 in [facility]  AUDIT-C Score 4       Immunizations and Health Maintenance: Health Maintenance  Topic Date Due   Zoster Vaccines- Shingrix (1 of 2) Never done   PAP SMEAR-Modifier  01/29/2020   INFLUENZA VACCINE  02/04/2021   COVID-19 Vaccine (3 - Booster for Fisher series) 04/14/2021 (  Originally 04/23/2020)   MAMMOGRAM  04/19/2021   TETANUS/TDAP  02/08/2025   COLONOSCOPY (Pts 45-59yr Insurance coverage will need to be confirmed)  05/04/2027   Hepatitis C Screening  Completed   HIV Screening  Completed   HPV VACCINES  Aged Out     Hep C Screening: done   STD testing and prevention (HIV/chl/gon/syphilis):  see above, no additional testing desired by pt today  Intimate partner violence: denies   Sexual History/Pain during Intercourse: Married - husband, denies problems with intercourse  Menstrual History/LMP/Abnormal Bleeding:  Patient's last menstrual period was 03/22/2021 (approximate). Slight change in menses - now some cycles are lighter than others, recent LMP was lighter Still mentrating -  Regular cycle lasts 5-7 days, 2nd and 3rd and heaviest days of bleeding  - changing pads every 2  hours on heavy days    Incontinence Symptoms: none   Breast cancer: due in oct Last Mammogram: *see HM list above BRCA gene screening:   Cervical cancer screening: overdue - doing today Family hx of cancers - breast, ovarian, uterine, colon:    Maternal grandfather colon CA - 745's- diagnosed a while before he died   Osteoporosis:   Discussion on osteoporosis per age, including high calcium and vitamin D supplementation, weight bearing exercises Not supplementing  Skin cancer:  Hx of skin CA -  NO Discussed atypical lesions   Colorectal cancer:   Colonoscopy is UTD Discussed concerning signs and sx of CRC, pt denies melena, hematochezia, change in bowels, recent colonoscopy and 10 year f/up reviewed  Lung cancer:   Low Dose CT Chest recommended if Age 51-80years, 20 pack-year currently smoking OR have quit w/in 15years. Patient does not qualify.    Social History   Tobacco Use   Smoking status: Former    Packs/day: 0.25    Years: 1.00    Pack years: 0.25    Types: Cigarettes    Quit date: 01/30/2011    Years since quitting: 10.1   Smokeless tobacco: Never  Vaping Use   Vaping Use: Never used  Substance Use Topics   Alcohol use: Yes    Alcohol/week: 4.0 - 7.0 standard drinks    Types: 4 - 7 Standard drinks or equivalent per week    Comment: ocassional   Drug use: No     Flowsheet Row [facility]UD[facility]C Score 4       Family History  Problem Relation Age of Onset   Hypertension Mother    Thyroid disease Mother    Diabetes Brother    Heart attack Brother    Heart disease Father 540      heart attack   Breast cancer Paternal Aunt    Asthma Maternal Grandmother    Cancer Maternal Grandfather        prostate?   Heart attack Paternal Grandmother      Blood pressure/Hypertension: BP Readings from Last 3 Encounters:  03/29/21 (!) 166/102  05/03/20 134/75  02/16/20 124/76    Weight/Obesity: Wt  Readings from Last 3 Encounters:  03/29/21 253 lb 4.8 oz (114.9 kg)  05/03/20 255 lb (115.7 kg)  02/16/20 255 lb 4.8 oz (115.8 kg)   BMI Readings from Last 3 Encounters:  03/29/21 44.87 kg/m  05/03/20 46.64 kg/m  02/16/20 45.95 kg/m     Lipids:  Lab Results  Component Value Date   CHOL 183 02/17/2020   CHOL 182 02/14/2019   CHOL 168  02/03/2018   Lab Results  Component Value Date   HDL 42 02/17/2020   HDL 46 02/14/2019   HDL 41 02/03/2018   Lab Results  Component Value Date   LDLCALC 120 (H) 02/17/2020   LDLCALC 117 (H) 02/14/2019   LDLCALC 113 (H) 02/03/2018   Lab Results  Component Value Date   TRIG 116 02/17/2020   TRIG 95 02/14/2019   TRIG 71 02/03/2018   Lab Results  Component Value Date   CHOLHDL 4.4 02/17/2020   CHOLHDL 4.0 02/14/2019   CHOLHDL 4.1 02/03/2018   No results found for: LDLDIRECT Based on the results of lipid panel his/her cardiovascular risk factor ( using [place] )  in the next 10 years is: The 10-year ASCVD risk score (Arnett DK, et al., 2019) is: 7.5%   Values used to calculate the score:     Age: 57 years     Sex: Female     Is Non-Hispanic African American: Yes     Diabetic: No     Tobacco smoker: No     Systolic Blood Pressure: 939 mmHg     Is BP treated: No     HDL Cholesterol: 42 mg/dL     Total Cholesterol: 183 mg/dL  Glucose:  Glucose  Date Value Ref Range Status  02/17/2020 93 65 - 99 mg/dL Final  02/14/2019 109 (H) 65 - 99 mg/dL Final  02/03/2018 91 65 - 99 mg/dL Final    Advanced Care Planning:  A voluntary discussion about advance care planning including the explanation and discussion of advance directives.   Discussed health care proxy and Living will, and the patient was able to identify a health care proxy ashusband.   Patient does not know have a living will at present time.   Social History       Social History   Socioeconomic History   Marital status: Married    Spouse name: Not on file    Number of children: Not on file   Years of education: Not on file   Highest education level: Not on file  Occupational History   Not on file  Tobacco Use   Smoking status: Former    Packs/day: 0.25    Years: 1.00    Pack years: 0.25    Types: Cigarettes    Quit date: 01/30/2011    Years since quitting: 10.1   Smokeless tobacco: Never  Vaping Use   Vaping Use: Never used  Substance and Sexual Activity   Alcohol use: Yes    Alcohol/week: 4.0 - 7.0 standard drinks    Types: 4 - 7 Standard drinks or equivalent per week    Comment: ocassional   Drug use: No   Sexual activity: Yes    Partners: Male  Other Topics Concern   Not on file  Social History Narrative   Not on file   Social Determinants of Health   Financial Resource Strain: Low Risk    Difficulty of Paying Living Expenses: Not hard at all  Food Insecurity: No Food Insecurity   Worried About Charity fundraiser in the Last Year: Never true   [place] in the Last Year: Never true  Transportation Needs: No Transportation Needs   Lack of Transportation (Medical): No   Lack of Transportation (Non-Medical): No  Physical Activity: Inactive   Days of Exercise per Week: 0 days   Minutes of Exercise per Session: 0 min  Stress: No Stress Concern Present  Feeling of Stress : Only a little  Social Connections: Moderately Isolated   Frequency of Communication with Friends and Family: Twice a week   Frequency of Social Gatherings with Friends and Family: Once a week   Attends Religious Services: Never   Marine scientist or Organizations: No   Attends Music therapist: Never   Marital Status: Married    Family History        Family History  Problem Relation Age of Onset   Hypertension Mother    Thyroid disease Mother    Diabetes Brother    Heart attack Brother    Heart disease Father 52       heart attack   Breast cancer Paternal Aunt    Asthma Maternal Grandmother    Cancer Maternal  Grandfather        prostate?   Heart attack Paternal Grandmother     Patient Active Problem List   Diagnosis Date Noted   Personal history of colonic polyps    Benign neoplasm of sigmoid colon    De Quervain's disease (radial styloid tenosynovitis) 02/09/2015   Acne inversa 02/09/2015   Obesity, Class III, BMI 40-49.9 (morbid obesity) ([place]) 02/09/2015    Past Surgical History:  Procedure Laterality Date   CESAREAN SECTION     COLONOSCOPY WITH PROPOFOL N/A 03/25/2016   Procedure: COLONOSCOPY WITH PROPOFOL;  Surgeon: Lucilla Lame, MD;  Location: [facility] ENDOSCOPY;  Service: Endoscopy;  Laterality: N/A;   COLONOSCOPY WITH PROPOFOL N/A 05/03/2020   Procedure: COLONOSCOPY WITH PROPOFOL;  Surgeon: Lucilla Lame, MD;  [facility] ENDOSCOPY;  Service: Endoscopy;  Laterality: N/A;   TUBAL LIGATION      No current outpatient medications on file.  No Known Allergies  Patient Care Team: Delsa Grana, PA-C as PCP - General (Family Medicine)   Chart Review: I personally reviewed active problem list, medication list, allergies, family history, social history, health maintenance, notes from last encounter, lab results, imaging with the patient/caregiver today.   Review of Systems  Constitutional: Negative.   HENT: Negative.    Eyes: Negative.   Respiratory: Negative.    Cardiovascular: Negative.   Gastrointestinal: Negative.   Endocrine: Negative.   Genitourinary: Negative.   Musculoskeletal: Negative.   Skin: Negative.   Allergic/Immunologic: Negative.   Neurological: Negative.   Hematological: Negative.   Psychiatric/Behavioral: Negative.    All other systems reviewed and are negative.        Objective:   Vitals:  Vitals:   03/29/21 1441 03/29/21 1509  BP: (!) 166/102 (!) 166/102  Pulse: 84   Resp: 16   Temp: 98.4 F (36.9 C)   TempSrc: Oral   SpO2: 97%   Weight: 253 lb 4.8 oz (114.9 kg)   Height: _0  (1.6 m)     Body mass index is 44.87 kg/m.  Physical  Exam Vitals and nursing note reviewed.  Constitutional:      General: She is not in acute distress.    Appearance: Normal appearance. She is well-developed. She is obese. She is not ill-appearing, toxic-appearing or diaphoretic.     Interventions: Face mask in place.  HENT:     Head: Normocephalic and atraumatic.     Right Ear: External ear normal.     Left Ear: External ear normal.     Nose: Nose normal.     Mouth/Throat:     Mouth: Mucous membranes are moist.     Pharynx: Oropharynx is clear.  Eyes:  General: Lids are normal. No scleral icterus.       Right eye: No discharge.        Left eye: No discharge.     Conjunctiva/sclera: Conjunctivae normal.  Neck:     Thyroid: No thyroid mass, thyromegaly or thyroid tenderness.     Trachea: Trachea and phonation normal. No tracheal deviation.  Cardiovascular:     Rate and Rhythm: Normal rate and regular rhythm.     Pulses: Normal pulses.          Radial pulses are 2+ on the right side and 2+ on the left side.       Posterior tibial pulses are 2+ on the right side and 2+ on the left side.     Heart sounds: Normal heart sounds. No murmur heard.   No friction rub. No gallop.  Pulmonary:     Effort: Pulmonary effort is normal. No respiratory distress.     Breath sounds: Normal breath sounds. No stridor. No wheezing, rhonchi or rales.  Chest:     Chest wall: No tenderness.  Abdominal:     General: Bowel sounds are normal. There is no distension.     Palpations: Abdomen is soft.     Tenderness: There is no abdominal tenderness. There is no left CVA tenderness or guarding.  Genitourinary:    Comments: PAP obtained, pelvic and bimanual done with chaperone  Musculoskeletal:     Cervical back: Normal range of motion.     Right lower leg: No edema.     Left lower leg: No edema.  Lymphadenopathy:     Cervical: No cervical adenopathy.  Skin:    General: Skin is warm and dry.     Capillary Refill: Capillary refill takes less than 2  seconds.     Coloration: Skin is not jaundiced or pale.     Findings: No bruising or rash.  Neurological:     Mental Status: She is alert. Mental status is at baseline.     Motor: No abnormal muscle tone.     Gait: Gait normal.  Psychiatric:        Mood and Affect: Mood normal.        Speech: Speech normal.        Behavior: Behavior normal.      Fall Risk: Fall Risk  03/29/2021 02/16/2020 02/14/2019 09/07/2018 04/14/2018  Falls in the past year? 0 0 0 0 No  Number falls in past yr: 0 0 0 0 -  Injury with Fall? 0 0 0 0 -  Risk for fall due to : - History of fall(s) - - -    Functional Status Survey: Is the patient deaf or have difficulty hearing?: No Does the patient have difficulty seeing, even when wearing glasses/contacts?: No Does the patient have difficulty concentrating, remembering, or making decisions?: No Does the patient have difficulty walking or climbing stairs?: No Does the patient have difficulty dressing or bathing?: No Does the patient have difficulty doing errands alone such as visiting a doctor's office or shopping?: No   Assessment & Plan:    CPE completed today  USPSTF grade A and B recommendations reviewed with patient; age-appropriate recommendations, preventive care, screening tests, etc discussed and encouraged; healthy living encouraged; see AVS for patient education given to patient  Discussed importance of 150 minutes of physical activity weekly, AHA exercise recommendations given to pt in AVS/handout  Discussed importance of healthy diet:  eating lean meats and proteins, avoiding trans fats and  saturated fats, avoid simple sugars and excessive carbs in diet, eat 6 servings of fruit/vegetables daily and drink plenty of water and avoid sweet beverages.    Recommended pt to do annual eye exam and routine dental exams/cleanings  Depression, alcohol, fall screening completed as documented above and per flowsheets  Advance Care planning information and  packet discussed and offered today, encouraged pt to discuss with family members/spouse/partner/friends and complete Advanced directive packet and bring copy to office   Reviewed Health Maintenance: Health Maintenance  Topic Date Due   Zoster Vaccines- Shingrix (1 of 2) Never done   PAP SMEAR-Modifier  01/29/2020   INFLUENZA VACCINE  02/04/2021   COVID-19 Vaccine (3 - Booster for Pfizer series) 04/14/2021 (Originally 04/23/2020)   MAMMOGRAM  04/19/2021   TETANUS/TDAP  02/08/2025   COLONOSCOPY (Pts 45-42yr Insurance coverage will need to be confirmed)  05/04/2027   Hepatitis C Screening  Completed   HIV Screening  Completed   HPV VACCINES  Aged Out    Immunizations: Immunization History  Administered Date(s) Administered   Influenza,inj,Quad PF,6+ Mos 09/07/2018   PFIZER(Purple Top)SARS-COV-2 Vaccination 10/29/2019, 11/22/2019   Tdap 02/09/2015   Vaccines:  HPV: up to at age 51, ask insurance if age between 261-45 Shingrix: 545-64yo and ask insurance if covered when patient above 622yo Pneumonia:  educated and discussed with patient. Flu:  educated and discussed with patient. COVID:      ICD-10-CM   1. Annual physical exam  Z00.00 Cytology - PAP    CBC w/Diff/Platelet    Lipid Profile    Comprehensive Metabolic Panel (CMET)    CANCELED: COMPLETE METABOLIC PANEL WITH GFR    CANCELED: CBC w/Diff/Platelet    CANCELED: Lipid Profile    2. Obesity, Class III, BMI 40-49.9 (morbid obesity) (HCC)  E66.01 Lipid Profile    Comprehensive Metabolic Panel (CMET)    3. Screening for cervical cancer  Z12.4 Cytology - PAP    4. Encounter for screening mammogram for malignant neoplasm of breast  Z12.31 MM 3D SCREEN BREAST BILATERAL    5. Need for influenza vaccination  Z23 CANCELED: Flu Vaccine QUAD 6+ mos PF IM (Fluarix Quad PF)    6. Hyperlipidemia, unspecified hyperlipidemia type  E78.5 Lipid Profile    Comprehensive Metabolic Panel (CMET)    7. Family history of thyroid  disease in mother  ZZ59.49TSH    CANCELED: TSH    8. Family history of cardiac disorder  Z82.49     9. Hypertension, unspecified type  I10      New HTN? Pt not sleeping well, wants to monitor - encouraged to work on dash, increase exercise, monitor BP or return for nurse BP recheck and will need to start meds if continued elevation >130/80 No current concerning sx      LDelsa Grana PA-C 03/29/21 3:05 PM  [place]

## 2021-03-29 NOTE — Patient Instructions (Addendum)
Health Maintenance  Topic Date Due   Zoster (Shingles) Vaccine (1 of 2) Never done   Pap Smear  01/29/2020   Flu Shot  02/04/2021   COVID-19 Vaccine (3 - Booster for Pfizer series) 04/14/2021*   Mammogram  04/19/2021   Tetanus Vaccine  02/08/2025   Colon Cancer Screening  05/04/2027   Hepatitis C Screening: USPSTF Recommendation to screen - Ages 18-51 yo.  Completed   HIV Screening  Completed   HPV Vaccine  Aged Out  *Topic was postponed. The date shown is not the original due date.   Preventing Osteoporosis, Adult Osteoporosis is a condition that causes the bones to lose density. This means that the bones become thinner, and the normal spaces in bone tissue become larger. Low bone density can make the bones weak and cause them to break more easily. Osteoporosis cannot always be prevented, but you can take steps to lower your risk of developing this condition. How can this condition affect me? If you develop osteoporosis, you will be more likely to break bones in your wrist, spine, or hip. Even a minor accident or injury can be enough to break weak bones. The bones will also be slower to heal. Osteoporosis can cause other problems as well, such as a stooped posture or trouble with movement. Osteoporosis can occur with aging. As you get older, you may lose bone tissue more quickly, or it may be replaced more slowly. Osteoporosis is more likely to develop if you have poor nutrition or do not get enough calcium or vitamin D. Other lifestyle factors can also play a role. By eating a well-balanced diet and making lifestyle changes, you can help keep your bones strong and healthy, lowering your chances of developing osteoporosis. What can increase my risk? The following factors may make you more likely to develop osteoporosis: Having a family history of the condition. Having poor nutrition or not getting enough calcium or vitamin D. Using certain medicines, such as steroid medicines or anti-seizure  medicines. Being any of the following: 77 years of age or older. Female. A woman who has gone through menopause (is postmenopausal). A person who is of European or Asian descent. Using products that contain nicotine or tobacco, such as cigarettes, e-cigarettes, and chewing tobacco. Not being physically active (being sedentary). Having a small body frame. What actions can I take to prevent this? Get enough calcium  Make sure you get enough calcium every day. Calcium is the most important mineral for bone health. Most people can get enough calcium from their diet, but supplements may be recommended for people who are at risk for osteoporosis. Follow these guidelines: If you are age 83 or younger, aim to get 1,000 milligrams (mg) of calcium every day. If you are older than age 70, aim to get 1,200 mg of calcium every day. Good sources of calcium include: Dairy products, such as low-fat or nonfat milk, cheese, and yogurt. Dark green leafy vegetables, such as bok choy and broccoli. Foods that have had calcium added to them (calcium-fortified foods), such as orange juice, cereal, bread, soy beverages, and tofu products. Nuts, such as almonds. Check nutrition labels to see how much calcium is in a food or drink. Get enough vitamin D Try to get enough vitamin D every day. Vitamin D is the most essential vitamin for bone health. It helps the body absorb calcium. Follow these guidelines for how much vitamin D to get from food: If you are age 39 or younger, aim to  get at least 600 international units (IU) every day. Your health care provider may suggest more. If you are older than age 49, aim to get at least 800 international units every day. Your health care provider may suggest more. Good sources of vitamin D in your diet include: Egg yolks. Oily fish, such as salmon, sardines, and tuna. Milk and cereal fortified with vitamin D. Your body also makes vitamin D when you are out in the sun. Exposing  the bare skin on your face, arms, legs, or back to the sun for no more than 30 minutes a day, 2 times a week is more than enough. Beyond that, make sure you use sunblock to protect your skin from sunburn, which increases your risk for skin cancer. Exercise  Stay active and get exercise every day. Ask your health care provider what types of exercise are best for you. Weight-bearing and strength-building activities are important for building and maintaining healthy bones. Some examples of these types of activities include: Walking and hiking. Jogging and running. Dancing. Gym exercises and lifting weights. Tennis and racquetball. Climbing stairs. Tai chi. Make other lifestyle changes Do not use any products that contain nicotine or tobacco, such as cigarettes, e-cigarettes, and chewing tobacco. If you need help quitting, ask your health care provider. Lose weight if you are overweight. If you drink alcohol: Limit how much you use to: 0-1 drink a day for women who are not pregnant. 0-2 drinks a day for men. Be aware of how much alcohol is in your drink. In the [place] one drink equals one 12 oz bottle of beer (355 mL), one 5 oz glass of wine (148 mL), or one 1 oz glass of hard liquor (44 mL). Where to find support If you need help making changes to prevent osteoporosis, talk with your health care provider. You can ask for a referral to a dietitian and a physical therapist. Where to find more information Learn more about osteoporosis from: NIH Osteoporosis and Related [place]: www.bones.SouthExposed.es [place] Office on [employer] Health: [place]s.[place]: EquipmentWeekly.com.ee Summary Osteoporosis is a condition that causes weak bones that are more likely to break. Eat a healthy diet, making sure you get enough calcium and vitamin D, and stay active by getting regular exercise to help prevent osteoporosis. Other ways to reduce your risk of  osteoporosis include maintaining a healthy weight and avoiding alcohol and products that contain nicotine or tobacco. This information is not intended to replace advice given to you by your health care provider. Make sure you discuss any questions you have with your health care provider. Document Revised: 12/08/2019 Document Reviewed: 12/08/2019 Elsevier Patient Education  2022 Elsevier Inc.     Preventive Care 61-34 Years Old, Female Preventive care refers to lifestyle choices and visits with your health care provider that can promote health and wellness. This includes: A yearly physical exam. This is also called an annual wellness visit. Regular dental and eye exams. Immunizations. Screening for certain conditions. Healthy lifestyle choices, such as: Eating a healthy diet. Getting regular exercise. Not using drugs or products that contain nicotine and tobacco. Limiting alcohol use. What can I expect for my preventive care visit? Physical exam Your health care provider will check your: Height and weight. These may be used to calculate your BMI (body mass index). BMI is a measurement that tells if you are at a healthy weight. Heart rate and blood pressure. Body temperature. Skin for abnormal spots. Counseling Your health  care provider may ask you questions about your: Past medical problems. Family's medical history. Alcohol, tobacco, and drug use. Emotional well-being. Home life and relationship well-being. Sexual activity. Diet, exercise, and sleep habits. Work and work Statistician. Access to firearms. Method of birth control. Menstrual cycle. Pregnancy history. What immunizations do I need? Vaccines are usually given at various ages, according to a schedule. Your health care provider will recommend vaccines for you based on your age, medical history, and lifestyle or other factors, such as travel or where you work. What tests do I need? Blood tests Lipid and cholesterol  levels. These may be checked every 5 years, or more often if you are over 50 years old. Hepatitis C test. Hepatitis B test. Screening Lung cancer screening. You may have this screening every year starting at age 52 if you have a 30-pack-year history of smoking and currently smoke or have quit within the past 15 years. Colorectal cancer screening. All adults should have this screening starting at age 89 and continuing until age 18. Your health care provider may recommend screening at age 32 if you are at increased risk. You will have tests every 1-10 years, depending on your results and the type of screening test. Diabetes screening. This is done by checking your blood sugar (glucose) after you have not eaten for a while (fasting). You may have this done every 1-3 years. Mammogram. This may be done every 1-2 years. Talk with your health care provider about when you should start having regular mammograms. This may depend on whether you have a family history of breast cancer. BRCA-related cancer screening. This may be done if you have a family history of breast, ovarian, tubal, or peritoneal cancers. Pelvic exam and Pap test. This may be done every 3 years starting at age 108. Starting at age 45, this may be done every 5 years if you have a Pap test in combination with an HPV test. Other tests STD (sexually transmitted disease) testing, if you are at risk. Bone density scan. This is done to screen for osteoporosis. You may have this scan if you are at high risk for osteoporosis. Talk with your health care provider about your test results, treatment options, and if necessary, the need for more tests. Follow these instructions at home: Eating and drinking  Eat a diet that includes fresh fruits and vegetables, whole grains, lean protein, and low-fat dairy products. Take vitamin and mineral supplements as recommended by your health care provider. Do not drink alcohol if: Your health care  provider tells you not to drink. You are pregnant, may be pregnant, or are planning to become pregnant. If you drink alcohol: Limit how much you have to 0-1 drink a day. Be aware of how much alcohol is in your drink. In the [place] one drink equals one 12 oz bottle of beer (355 mL), one 5 oz glass of wine (148 mL), or one 1 oz glass of hard liquor (44 mL). Lifestyle Take daily care of your teeth and gums. Brush your teeth every morning and night with fluoride toothpaste. Floss one time each day. Stay active. Exercise for at least 30 minutes 5 or more days each week. Do not use any products that contain nicotine or tobacco, such as cigarettes, e-cigarettes, and chewing tobacco. If you need help quitting, ask your health care provider. Do not use drugs. If you are sexually active, practice safe sex. Use a condom or other form of protection to prevent STIs (sexually transmitted infections).  If you do not wish to become pregnant, use a form of birth control. If you plan to become pregnant, see your health care provider for a prepregnancy visit. If told by your health care provider, take low-dose aspirin daily starting at age 54. Find healthy ways to cope with stress, such as: Meditation, yoga, or listening to music. Journaling. Talking to a trusted person. Spending time with friends and family. Safety Always wear your seat belt while driving or riding in a vehicle. Do not drive: If you have been drinking alcohol. Do not ride with someone who has been drinking. When you are tired or distracted. While texting. Wear a helmet and other protective equipment during sports activities. If you have firearms in your house, make sure you follow all gun safety procedures. What's next? Visit your health care provider once a year for an annual wellness visit. Ask your health care provider how often you should have your eyes and teeth checked. Stay up to date on all vaccines. This information is not  intended to replace advice given to you by your health care provider. Make sure you discuss any questions you have with your health care provider. Document Revised: 08/31/2020 Document Reviewed: 03/04/2018 Elsevier Patient Education  2022 [employer].

## 2021-04-03 LAB — CBC WITH DIFFERENTIAL/PLATELET
Basophils Absolute: 0.1 10*3/uL (ref 0.0–0.2)
Basos: 1 %
EOS (ABSOLUTE): 0.8 10*3/uL — ABNORMAL HIGH (ref 0.0–0.4)
Eos: 9 %
Hematocrit: 34.3 % (ref 34.0–46.6)
Hemoglobin: 10.5 g/dL — ABNORMAL LOW (ref 11.1–15.9)
Immature Grans (Abs): 0 10*3/uL (ref 0.0–0.1)
Immature Granulocytes: 0 %
Lymphocytes Absolute: 3.2 10*3/uL — ABNORMAL HIGH (ref 0.7–3.1)
Lymphs: 37 %
MCH: 25.2 pg — ABNORMAL LOW (ref 26.6–33.0)
MCHC: 30.6 g/dL — ABNORMAL LOW (ref 31.5–35.7)
MCV: 83 fL (ref 79–97)
Monocytes Absolute: 0.8 10*3/uL (ref 0.1–0.9)
Monocytes: 9 %
Neutrophils Absolute: 3.9 10*3/uL (ref 1.4–7.0)
Neutrophils: 44 %
Platelets: 278 10*3/uL (ref 150–450)
RBC: 4.16 x10E6/uL (ref 3.77–5.28)
RDW: 14.1 % (ref 11.7–15.4)
WBC: 8.8 10*3/uL (ref 3.4–10.8)

## 2021-04-03 LAB — CYTOLOGY - PAP
Comment: NEGATIVE
Diagnosis: NEGATIVE
Diagnosis: REACTIVE
High risk HPV: NEGATIVE

## 2021-04-03 LAB — COMPREHENSIVE METABOLIC PANEL
ALT: 16 IU/L (ref 0–32)
AST: 23 IU/L (ref 0–40)
Albumin/Globulin Ratio: 1.4 (ref 1.2–2.2)
Albumin: 4.3 g/dL (ref 3.8–4.9)
Alkaline Phosphatase: 87 IU/L (ref 44–121)
BUN/Creatinine Ratio: 16 (ref 9–23)
BUN: 12 mg/dL (ref 6–24)
Bilirubin Total: 0.3 mg/dL (ref 0.0–1.2)
CO2: 20 mmol/L (ref 20–29)
Calcium: 9.1 mg/dL (ref 8.7–10.2)
Chloride: 102 mmol/L (ref 96–106)
Creatinine, Ser: 0.75 mg/dL (ref 0.57–1.00)
Globulin, Total: 3 g/dL (ref 1.5–4.5)
Glucose: 87 mg/dL (ref 70–99)
Potassium: 4.4 mmol/L (ref 3.5–5.2)
Sodium: 140 mmol/L (ref 134–144)
Total Protein: 7.3 g/dL (ref 6.0–8.5)
eGFR: 96 mL/min/{1.73_m2} (ref >59)

## 2021-04-03 LAB — LIPID PANEL
Chol/HDL Ratio: 4.6 ratio — ABNORMAL HIGH (ref 0.0–4.4)
Cholesterol, Total: 182 mg/dL (ref 100–199)
HDL: 40 mg/dL (ref >39)
LDL Chol Calc (NIH): 121 mg/dL — ABNORMAL HIGH (ref 0–99)
Triglycerides: 117 mg/dL (ref 0–149)
VLDL Cholesterol Cal: 21 mg/dL (ref 5–40)

## 2021-04-03 LAB — TSH: TSH: 1.33 u[IU]/mL (ref 0.450–4.500)

## 2021-04-10 DIAGNOSIS — E785 Hyperlipidemia, unspecified: Secondary | ICD-10-CM | POA: Insufficient documentation

## 2021-04-30 ENCOUNTER — Other Ambulatory Visit: Payer: Self-pay

## 2021-04-30 ENCOUNTER — Ambulatory Visit
Admission: RE | Admit: 2021-04-30 | Discharge: 2021-04-30 | Disposition: A | Discharge status: Home / Self Care | Payer: Managed Care, Other (non HMO) | Source: Ambulatory Visit | Attending: Family Medicine | Admitting: Family Medicine

## 2021-04-30 DIAGNOSIS — Z1231 Encounter for screening mammogram for malignant neoplasm of breast: Secondary | ICD-10-CM | POA: Insufficient documentation

## 2021-05-03 ENCOUNTER — Other Ambulatory Visit: Payer: Self-pay | Admitting: Family Medicine

## 2021-05-03 DIAGNOSIS — N6489 Other specified disorders of breast: Secondary | ICD-10-CM

## 2021-05-03 DIAGNOSIS — R928 Other abnormal and inconclusive findings on diagnostic imaging of breast: Secondary | ICD-10-CM

## 2021-05-15 ENCOUNTER — Ambulatory Visit
Admission: RE | Admit: 2021-05-15 | Discharge: 2021-05-15 | Disposition: A | Discharge status: Home / Self Care | Payer: Managed Care, Other (non HMO) | Source: Ambulatory Visit | Attending: Family Medicine | Admitting: Family Medicine

## 2021-05-15 ENCOUNTER — Other Ambulatory Visit: Payer: Self-pay

## 2021-05-15 DIAGNOSIS — N6489 Other specified disorders of breast: Secondary | ICD-10-CM | POA: Insufficient documentation

## 2021-05-15 DIAGNOSIS — R928 Other abnormal and inconclusive findings on diagnostic imaging of breast: Secondary | ICD-10-CM

## 2021-07-11 ENCOUNTER — Ambulatory Visit: Payer: Managed Care, Other (non HMO) | Admitting: Family Medicine

## 2021-07-12 ENCOUNTER — Encounter: Payer: Self-pay | Admitting: Nurse Practitioner

## 2021-07-12 ENCOUNTER — Ambulatory Visit: Payer: Managed Care, Other (non HMO) | Admitting: Nurse Practitioner

## 2021-07-12 ENCOUNTER — Other Ambulatory Visit: Payer: Self-pay

## 2021-07-12 VITALS — BP 138/78 | HR 74 | Temp 98.8°F | Resp 16 | Ht 63.0 in | Wt 252.1 lb

## 2021-07-12 DIAGNOSIS — E785 Hyperlipidemia, unspecified: Secondary | ICD-10-CM | POA: Diagnosis not present

## 2021-07-12 DIAGNOSIS — R030 Elevated blood-pressure reading, without diagnosis of hypertension: Secondary | ICD-10-CM | POA: Diagnosis not present

## 2021-07-12 DIAGNOSIS — Z23 Encounter for immunization: Secondary | ICD-10-CM | POA: Diagnosis not present

## 2021-07-12 NOTE — Progress Notes (Signed)
BP 138/78    Pulse 74    Temp 98.8 F (37.1 C) (Oral)    Resp 16    Ht _0  (1.6 m)    Wt 252 lb 1.6 oz (114.4 kg)    SpO2 99%    BMI 44.66 kg/m    Subjective:    Patient ID: Judy Robinson, female    DOB: 1970/09/16, 51 y.o.   MRN: [id number]  HPI: Judy Robinson is a 51 y.o. female  Chief Complaint  Patient presents with   Follow-up    Blood pressure check   Blood pressure: Her blood pressure at her physical on 03/29/21 was 166/102.  She says she has checked her blood pressure a couple of times at home but not consistently.  She says she blood pressure when she did check it was in the 130s/80s. Her blood pressure today is 138/78.  She denies any chest pain, shortness of breath, headaches, or blurred vision.  Discussed DASH diet and to monitor blood pressure more consistently and follow-up in three months.  Hyperlipidemia: Her last LDL on 03/29/21 was 121.  Her ASCVD risk score at that time was 8%.  Her blood pressure has since come down and now her ASCVD risk score is 4%.  Not recommended to start medication.  Discussed modifying diet.  Will recheck labs at next visit.   The 10-year ASCVD risk score (Arnett DK, et al., 2019) is: 4%   Values used to calculate the score:     Age: 1 years     Sex: Female     Is Non-Hispanic African American: Yes     Diabetic: No     Tobacco smoker: No     Systolic Blood Pressure: 086 mmHg     Is BP treated: No     HDL Cholesterol: 40 mg/dL     Total Cholesterol: 182 mg/dL   Relevant past medical, surgical, family and social history reviewed and updated as indicated. Interim medical history since our last visit reviewed. Allergies and medications reviewed and updated.  Review of Systems  Constitutional: Negative for fever or weight change.  Respiratory: Negative for cough and shortness of breath.   Cardiovascular: Negative for chest pain or palpitations.  Gastrointestinal: Negative for abdominal pain, no bowel changes.   Musculoskeletal: Negative for gait problem or joint swelling.  Skin: Negative for rash.  Neurological: Negative for dizziness or headache.  No other specific complaints in a complete review of systems (except as listed in HPI above).      Objective:    BP 138/78    Pulse 74    Temp 98.8 F (37.1 C) (Oral)    Resp 16    Ht _1  (1.6 m)    Wt 252 lb 1.6 oz (114.4 kg)    SpO2 99%    BMI 44.66 kg/m   Wt Readings from Last 3 Encounters:  07/12/21 252 lb 1.6 oz (114.4 kg)  03/29/21 253 lb 4.8 oz (114.9 kg)  05/03/20 255 lb (115.7 kg)    Physical Exam  Constitutional: Patient appears well-developed and well-nourished. Obese No distress.  HEENT: head atraumatic, normocephalic, pupils equal and reactive to light, neck supple Cardiovascular: Normal rate, regular rhythm and normal heart sounds.  No murmur heard. No BLE edema. Pulmonary/Chest: Effort normal and breath sounds normal. No respiratory distress. Abdominal: Soft.  There is no tenderness. Psychiatric: Patient has a normal mood and affect. behavior is normal. Judgment and thought content normal.  Results for orders placed or performed in visit on 03/29/21  CBC w/Diff/Platelet  Result Value Ref Range   WBC 8.8 3.4 - 10.8 x10E3/uL   RBC 4.16 3.77 - 5.28 x10E6/uL   Hemoglobin 10.5 (L) 11.1 - 15.9 g/dL   Hematocrit 34.3 34.0 - 46.6 %   MCV 83 79 - 97 fL   MCH 25.2 (L) 26.6 - 33.0 pg   MCHC 30.6 (L) 31.5 - 35.7 g/dL   RDW 14.1 11.7 - 15.4 %   Platelets 278 150 - 450 x10E3/uL   Neutrophils 44 Not Estab. %   Lymphs 37 Not Estab. %   Monocytes 9 Not Estab. %   Eos 9 Not Estab. %   Basos 1 Not Estab. %   Neutrophils Absolute 3.9 1.4 - 7.0 x10E3/uL   Lymphocytes Absolute 3.2 (H) 0.7 - 3.1 x10E3/uL   Monocytes Absolute 0.8 0.1 - 0.9 x10E3/uL   EOS (ABSOLUTE) 0.8 (H) 0.0 - 0.4 x10E3/uL   Basophils Absolute 0.1 0.0 - 0.2 x10E3/uL   Immature Granulocytes 0 Not Estab. %   Immature Grans (Abs) 0.0 0.0 - 0.1 x10E3/uL  Lipid Profile   Result Value Ref Range   Cholesterol, Total 182 100 - 199 mg/dL   Triglycerides 117 0 - 149 mg/dL   HDL 40 >39 mg/dL   VLDL Cholesterol Cal 21 5 - 40 mg/dL   LDL Chol Calc (NIH) 121 (H) 0 - 99 mg/dL   Chol/HDL Ratio 4.6 (H) 0.0 - 4.4 ratio  TSH  Result Value Ref Range   TSH 1.330 0.450 - 4.500 uIU/mL  Comprehensive Metabolic Panel (CMET)  Result Value Ref Range   Glucose 87 70 - 99 mg/dL   BUN 12 6 - 24 mg/dL   Creatinine, Ser 0.75 0.57 - 1.00 mg/dL   eGFR 96 >59 mL/min/1.73   BUN/Creatinine Ratio 16 9 - 23   Sodium 140 134 - 144 mmol/L   Potassium 4.4 3.5 - 5.2 mmol/L   Chloride 102 96 - 106 mmol/L   CO2 20 20 - 29 mmol/L   Calcium 9.1 8.7 - 10.2 mg/dL   Total Protein 7.3 6.0 - 8.5 g/dL   Albumin 4.3 3.8 - 4.9 g/dL   Globulin, Total 3.0 1.5 - 4.5 g/dL   Albumin/Globulin Ratio 1.4 1.2 - 2.2   Bilirubin Total 0.3 0.0 - 1.2 mg/dL   Alkaline Phosphatase 87 44 - 121 IU/L   AST 23 0 - 40 IU/L   ALT 16 0 - 32 IU/L  Cytology - PAP  Result Value Ref Range   High risk HPV Negative    Adequacy      Satisfactory for evaluation; transformation zone component PRESENT.   Diagnosis      - Negative for Intraepithelial Lesions or Malignancy (NILM)   Diagnosis - Benign reactive/reparative changes    Comment Normal Reference Range HPV - Negative       Assessment & Plan:   1. Elevated blood pressure, situational - keep blood pressure log, and be consistent about checking blood pressure -DASH diet  2. Hyperlipidemia, unspecified hyperlipidemia type -diet modification  3. Need for influenza vaccination  - Flu Vaccine QUAD 6+ mos PF IM (Fluarix Quad PF)   Follow up plan: Return in about 3 months (around 10/10/2021) for follow up.

## 2021-10-14 ENCOUNTER — Encounter: Payer: Self-pay | Admitting: Nurse Practitioner

## 2021-10-14 ENCOUNTER — Other Ambulatory Visit: Payer: Self-pay

## 2021-10-14 ENCOUNTER — Ambulatory Visit: Payer: Managed Care, Other (non HMO) | Admitting: Nurse Practitioner

## 2021-10-14 VITALS — BP 138/84 | HR 100 | Temp 98.2°F | Resp 16 | Ht 63.0 in | Wt 252.1 lb

## 2021-10-14 DIAGNOSIS — R030 Elevated blood-pressure reading, without diagnosis of hypertension: Secondary | ICD-10-CM | POA: Diagnosis not present

## 2021-10-14 NOTE — Progress Notes (Signed)
? ?BP 138/84   Pulse 100   Temp 98.2 ?F (36.8 ?C) (Oral)   Resp 16   Ht 5' 3"  (1.6 m)   Wt 252 lb 1.6 oz (114.4 kg)   SpO2 98%   BMI 44.66 kg/m?   ? ?Subjective:  ? ? Patient ID: Judy Robinson, female    DOB: [birth year], 51 y.o.   MRN: [id number] ? ?HPI: ?Judy Robinson is a 51 y.o. female, here alone ? ?Chief Complaint  ?Patient presents with  ? Hypertension  ? ?Elevated blood pressure:  Her blood pressure was elevated at her cpe on 03/29/2021.  It was 166/102.  She came back for a follow up appointment on 07/12/2021, her blood pressure was 138/78.  She said she had checked her blood pressure a few times and it was in the 130s/80s.  Discussed DASH diet and monitoring her blood pressure. She says she has not really been checking her blood pressure at home but she denies any headaches, blurred vision, shortness of breath or chest pain. Her blood pressure today is 138/84. Discussed checking her blood pressure every once in awhile and watching to see if she gets any elevated readings if not will see her at her cpe in November.  ? ?Relevant past medical, surgical, family and social history reviewed and updated as indicated. Interim medical history since our last visit reviewed. ?Allergies and medications reviewed and updated. ? ?Review of Systems ?Constitutional: Negative for fever or weight change.  ?Respiratory: Negative for cough and shortness of breath.   ?Cardiovascular: Negative for chest pain or palpitations.  ?Gastrointestinal: Negative for abdominal pain, no bowel changes.  ?Musculoskeletal: Negative for gait problem or joint swelling.  ?Skin: Negative for rash.  ?Neurological: Negative for dizziness or headache.  ?No other specific complaints in a complete review of systems (except as listed in HPI above).  ?   ?Objective:  ?  ?BP 138/84   Pulse 100   Temp 98.2 ?F (36.8 ?C) (Oral)   Resp 16   Ht 5' 3"  (1.6 m)   Wt 252 lb 1.6 oz (114.4 kg)   SpO2 98%   BMI 44.66 kg/m?   ?Wt Readings from  Last 3 Encounters:  ?10/14/21 252 lb 1.6 oz (114.4 kg)  ?07/12/21 252 lb 1.6 oz (114.4 kg)  ?03/29/21 253 lb 4.8 oz (114.9 kg)  ?  ?Physical Exam ? ?Constitutional: Patient appears well-developed and well-nourished. Obese  No distress.  ?HEENT: head atraumatic, normocephalic, pupils equal and reactive to light,  neck supple ?Cardiovascular: Normal rate, regular rhythm and normal heart sounds.  No murmur heard. No BLE edema. ?Pulmonary/Chest: Effort normal and breath sounds normal. No respiratory distress. ?Abdominal: Soft.  There is no tenderness. ?Psychiatric: Patient has a normal mood and affect. behavior is normal. Judgment and thought content normal.  ?Results for orders placed or performed in visit on 03/29/21  ?CBC w/Diff/Platelet  ?Result Value Ref Range  ? WBC 8.8 3.4 - 10.8 x10E3/uL  ? RBC 4.16 3.77 - 5.28 x10E6/uL  ? Hemoglobin 10.5 (L) 11.1 - 15.9 g/dL  ? Hematocrit 34.3 34.0 - 46.6 %  ? MCV 83 79 - 97 fL  ? MCH 25.2 (L) 26.6 - 33.0 pg  ? MCHC 30.6 (L) 31.5 - 35.7 g/dL  ? RDW 14.1 11.7 - 15.4 %  ? Platelets 278 150 - 450 x10E3/uL  ? Neutrophils 44 Not Estab. %  ? Lymphs 37 Not Estab. %  ? Monocytes 9 Not Estab. %  ?  Eos 9 Not Estab. %  ? Basos 1 Not Estab. %  ? Neutrophils Absolute 3.9 1.4 - 7.0 x10E3/uL  ? Lymphocytes Absolute 3.2 (H) 0.7 - 3.1 x10E3/uL  ? Monocytes Absolute 0.8 0.1 - 0.9 x10E3/uL  ? EOS (ABSOLUTE) 0.8 (H) 0.0 - 0.4 x10E3/uL  ? Basophils Absolute 0.1 0.0 - 0.2 x10E3/uL  ? Immature Granulocytes 0 Not Estab. %  ? Immature Grans (Abs) 0.0 0.0 - 0.1 x10E3/uL  ?Lipid Profile  ?Result Value Ref Range  ? Cholesterol, Total 182 100 - 199 mg/dL  ? Triglycerides 117 0 - 149 mg/dL  ? HDL 40 >39 mg/dL  ? VLDL Cholesterol Cal 21 5 - 40 mg/dL  ? LDL Chol Calc (NIH) 121 (H) 0 - 99 mg/dL  ? Chol/HDL Ratio 4.6 (H) 0.0 - 4.4 ratio  ?TSH  ?Result Value Ref Range  ? TSH 1.330 0.450 - 4.500 uIU/mL  ?Comprehensive Metabolic Panel (CMET)  ?Result Value Ref Range  ? Glucose 87 70 - 99 mg/dL  ? BUN 12 6 - 24  mg/dL  ? Creatinine, Ser 0.75 0.57 - 1.00 mg/dL  ? eGFR 96 >59 mL/min/1.73  ? BUN/Creatinine Ratio 16 9 - 23  ? Sodium 140 134 - 144 mmol/L  ? Potassium 4.4 3.5 - 5.2 mmol/L  ? Chloride 102 96 - 106 mmol/L  ? CO2 20 20 - 29 mmol/L  ? Calcium 9.1 8.7 - 10.2 mg/dL  ? Total Protein 7.3 6.0 - 8.5 g/dL  ? Albumin 4.3 3.8 - 4.9 g/dL  ? Globulin, Total 3.0 1.5 - 4.5 g/dL  ? Albumin/Globulin Ratio 1.4 1.2 - 2.2  ? Bilirubin Total 0.3 0.0 - 1.2 mg/dL  ? Alkaline Phosphatase 87 44 - 121 IU/L  ? AST 23 0 - 40 IU/L  ? ALT 16 0 - 32 IU/L  ?Cytology - PAP  ?Result Value Ref Range  ? High risk HPV Negative   ? Adequacy    ?  Satisfactory for evaluation; transformation zone component PRESENT.  ? Diagnosis    ?  - Negative for Intraepithelial Lesions or Malignancy (NILM)  ? Diagnosis - Benign reactive/reparative changes   ? Comment Normal Reference Range HPV - Negative   ? ?   ?Assessment & Plan:  ? ?1. Elevated blood pressure, situational ?-continue to decrease salt intake ?-continue to check blood pressure and come back sooner if you notice elevated blood pressure readings.  ? ?Follow up plan: ?Return in about 6 months (around 04/15/2022) for cpe. ? ? ? ? ? ?

## 2022-03-31 ENCOUNTER — Ambulatory Visit (INDEPENDENT_AMBULATORY_CARE_PROVIDER_SITE_OTHER): Payer: Managed Care, Other (non HMO) | Admitting: Nurse Practitioner

## 2022-03-31 ENCOUNTER — Encounter: Payer: Self-pay | Admitting: Nurse Practitioner

## 2022-03-31 VITALS — BP 132/84 | HR 87 | Temp 98.4°F | Resp 16 | Ht 63.0 in | Wt 252.5 lb

## 2022-03-31 DIAGNOSIS — Z23 Encounter for immunization: Secondary | ICD-10-CM | POA: Diagnosis not present

## 2022-03-31 DIAGNOSIS — E785 Hyperlipidemia, unspecified: Secondary | ICD-10-CM

## 2022-03-31 DIAGNOSIS — Z0000 Encounter for general adult medical examination without abnormal findings: Secondary | ICD-10-CM | POA: Diagnosis not present

## 2022-03-31 DIAGNOSIS — Z131 Encounter for screening for diabetes mellitus: Secondary | ICD-10-CM

## 2022-03-31 DIAGNOSIS — Z1231 Encounter for screening mammogram for malignant neoplasm of breast: Secondary | ICD-10-CM

## 2022-03-31 DIAGNOSIS — I10 Essential (primary) hypertension: Secondary | ICD-10-CM

## 2022-03-31 NOTE — Progress Notes (Signed)
Name: Judy Robinson   MRN: [id number]    DOB: April 13, 1971   Date:03/31/2022       Progress Note  Subjective  Chief Complaint  Chief Complaint  Patient presents with   Annual Exam    HPI  Patient presents for annual CPE.  Diet: well balanced most of the time Exercise: she goes up and down her steps, discussed increasing physical activity to 150 minutes a week. Sleep: 5-6 hours Last dental exam:two years ago Last eye exam: last year  [place] Visit from 10/14/2021 in [facility]  AUDIT-C Score 0      Depression: Phq 9 is  negative    03/31/2022    8:42 AM 10/14/2021    1:18 PM 07/12/2021    1:11 PM 03/29/2021    2:45 PM 02/16/2020    9:46 AM  Depression screen PHQ 2/9  Decreased Interest 0 0 0 0 0  Down, Depressed, Hopeless 0 0 0 0 0  PHQ - 2 Score 0 0 0 0 0  Altered sleeping 0   0 0  Tired, decreased energy 0   0 0  Change in appetite 0   0 0  Feeling bad or failure about yourself  0   0 0  Trouble concentrating 0   0 0  Moving slowly or fidgety/restless 0   0 0  Suicidal thoughts 0   0 0  PHQ-9 Score 0   0 0  Difficult doing work/chores Not difficult at all   Not difficult at all Not difficult at all   Hypertension: BP Readings from Last 3 Encounters:  03/31/22 132/84  10/14/21 138/84  07/12/21 138/78   Obesity: Wt Readings from Last 3 Encounters:  03/31/22 252 lb 8 oz (114.5 kg)  10/14/21 252 lb 1.6 oz (114.4 kg)  07/12/21 252 lb 1.6 oz (114.4 kg)   BMI Readings from Last 3 Encounters:  03/31/22 44.73 kg/m  10/14/21 44.66 kg/m  07/12/21 44.66 kg/m     Vaccines:  HPV: up to at age 79 , ask insurance if age between 34-45  Shingrix: 65-64 yo and ask insurance if covered when patient above 43 yo Pneumonia:  educated and discussed with patient. Flu:  educated and discussed with patient.  Hep C Screening: 02/03/2018 STD testing and prevention (HIV/chl/gon/syphilis): 02/03/2018 Intimate partner violence:none Sexual  History :yes Menstrual History/LMP/Abnormal Bleeding: [facility]: 02/2022. irregular Incontinence Symptoms: none  Breast cancer:  - Last Mammogram: 04/30/2021 - BRCA gene screening: none  Osteoporosis: Discussed high calcium and vitamin D supplementation, weight bearing exercises  Cervical cancer screening: 03/29/2021  Skin cancer: Discussed monitoring for atypical lesions  Colorectal cancer: 05/03/2020   Lung cancer:   Low Dose CT Chest recommended if Age 51-80 years, 20 pack-year currently smoking OR have quit w/in 15years. Patient does not qualify.   ECG: none  Advanced Care Planning: A voluntary discussion about advance care planning including the explanation and discussion of advance directives.  Discussed health care proxy and Living will, and the patient was able to identify a health care proxy as husband.  Patient does not have a living will at present time. If patient does have living will, I have requested they bring this to the clinic to be scanned in to their chart.  Lipids: Lab Results  Component Value Date   CHOL 182 04/02/2021   CHOL 183 02/17/2020   CHOL 182 02/14/2019   Lab Results  Component Value Date   HDL 40 04/02/2021  HDL 42 02/17/2020   HDL 46 02/14/2019   Lab Results  Component Value Date   LDLCALC 121 (H) 04/02/2021   LDLCALC 120 (H) 02/17/2020   LDLCALC 117 (H) 02/14/2019   Lab Results  Component Value Date   TRIG 117 04/02/2021   TRIG 116 02/17/2020   TRIG 95 02/14/2019   Lab Results  Component Value Date   CHOLHDL 4.6 (H) 04/02/2021   CHOLHDL 4.4 02/17/2020   CHOLHDL 4.0 02/14/2019   No results found for: "LDLDIRECT"  Glucose: Glucose  Date Value Ref Range Status  04/02/2021 87 70 - 99 mg/dL Final    Comment:                  **Please note reference interval change**  02/17/2020 93 65 - 99 mg/dL Final  02/14/2019 109 (H) 65 - 99 mg/dL Final    Patient Active Problem List   Diagnosis Date Noted   Hyperlipidemia 04/10/2021   Personal  history of colonic polyps    Hypertension 02/03/2018   Benign neoplasm of sigmoid colon    Tennis Must Quervain's disease (radial styloid tenosynovitis) 02/09/2015   Acne inversa 02/09/2015   Obesity, Class III, BMI 40-49.9 (morbid obesity) ([place]) 02/09/2015    Past Surgical History:  Procedure Laterality Date   CESAREAN SECTION     COLONOSCOPY WITH PROPOFOL N/A 03/25/2016   Procedure: COLONOSCOPY WITH PROPOFOL;  Surgeon: Lucilla Lame, MD;  Location: [facility] ENDOSCOPY;  Service: Endoscopy;  Laterality: N/A;   COLONOSCOPY WITH PROPOFOL N/A 05/03/2020   Procedure: COLONOSCOPY WITH PROPOFOL;  Surgeon: Lucilla Lame, MD;  Location: [facility] ENDOSCOPY;  Service: Endoscopy;  Laterality: N/A;   TUBAL LIGATION      Family History  Problem Relation Age of Onset   Hypertension Mother    Thyroid disease Mother    Diabetes Brother    Heart attack Brother    Heart disease Father 39       heart attack   Breast cancer Paternal Aunt    Asthma Maternal Grandmother    Cancer Maternal Grandfather        prostate?   Heart attack Paternal Grandmother     Social History   Socioeconomic History   Marital status: Married    Spouse name: Not on file   Number of children: Not on file   Years of education: Not on file   Highest education level: Not on file  Occupational History   Not on file  Tobacco Use   Smoking status: Former    Packs/day: 0.25    Years: 1.00    Total pack years: 0.25    Types: Cigarettes    Quit date: 01/30/2011    Years since quitting: 11.1   Smokeless tobacco: Never  Vaping Use   Vaping Use: Never used  Substance and Sexual Activity   Alcohol use: Yes    Alcohol/week: 4.0 - 7.0 standard drinks of alcohol    Types: 4 - 7 Standard drinks or equivalent per week    Comment: ocassional   Drug use: No   Sexual activity: Yes    Partners: Male  Other Topics Concern   Not on file  Social History Narrative   Not on file   Social Determinants of Health   Financial Resource Strain:  Low Risk  (03/31/2022)   Overall Financial Resource Strain (CARDIA)    Difficulty of Paying Living Expenses: Not hard at all  Food Insecurity: No Food Insecurity (03/31/2022)   Hunger Vital Sign  Worried About Charity fundraiser in the Last Year: Never true    [place] in the Last Year: Never true  Transportation Needs: No Transportation Needs (03/31/2022)   PRAPARE - Hydrologist (Medical): No    Lack of Transportation (Non-Medical): No  Physical Activity: Insufficiently Active (03/31/2022)   Exercise Vital Sign    Days of Exercise per Week: 2 days    Minutes of Exercise per Session: 10 min  Stress: Stress Concern Present (03/31/2022)   [place]    Feeling of Stress : To some extent  Social Connections: Moderately Isolated (03/31/2022)   Social Connection and Isolation Panel [NHANES]    Frequency of Communication with Friends and Family: More than three times a week    Frequency of Social Gatherings with Friends and Family: Once a week    Attends Religious Services: Never    Marine scientist or Organizations: No    Attends Archivist Meetings: Never    Marital Status: Married  Human resources officer Violence: Not At Risk (03/31/2022)   Humiliation, Afraid, Rape, and Kick questionnaire    Fear of Current or Ex-Partner: No    Emotionally Abused: No    Physically Abused: No    Sexually Abused: No    No current outpatient medications on file.  No Known Allergies   ROS  Constitutional: Negative for fever or weight change.  Respiratory: Negative for cough and shortness of breath.   Cardiovascular: Negative for chest pain or palpitations.  Gastrointestinal: Negative for abdominal pain, no bowel changes.  Musculoskeletal: Negative for gait problem or joint swelling.  Skin: Negative for rash.  Neurological: Negative for dizziness or headache.  No other specific  complaints in a complete review of systems (except as listed in HPI above).   Objective  Vitals:   03/31/22 0842  BP: 132/84  Pulse: 87  Resp: 16  Temp: 98.4 F (36.9 C)  SpO2: 98%  Weight: 252 lb 8 oz (114.5 kg)  Height: _0  (1.6 m)    Body mass index is 44.73 kg/m.  Physical Exam  Constitutional: Patient appears well-developed and well-nourished. No distress.  HENT: Head: Normocephalic and atraumatic. Ears: B TMs ok, no erythema or effusion; Nose: Nose normal. Mouth/Throat: Oropharynx is clear and moist. No oropharyngeal exudate.  Eyes: Conjunctivae and EOM are normal. Pupils are equal, round, and reactive to light. No scleral icterus.  Neck: Normal range of motion. Neck supple. No JVD present. No thyromegaly present.  Cardiovascular: Normal rate, regular rhythm and normal heart sounds.  No murmur heard. No BLE edema. Pulmonary/Chest: Effort normal and breath sounds normal. No respiratory distress. Abdominal: Soft. Bowel sounds are normal, no distension. There is no tenderness. no masses Musculoskeletal: Normal range of motion, no joint effusions. No gross deformities Neurological: he is alert and oriented to person, place, and time. No cranial nerve deficit. Coordination, balance, strength, speech and gait are normal.  Skin: Skin is warm and dry. No rash noted. No erythema.  Psychiatric: Patient has a normal mood and affect. behavior is normal. Judgment and thought content normal.   No results found for this or any previous visit (from the past 2160 hour(s)).    Fall Risk:    03/31/2022    8:42 AM 10/14/2021    1:17 PM 07/12/2021    1:10 PM 03/29/2021    2:44 PM 02/16/2020    9:46 AM  Fall  Risk   Falls in the past year? 0 0 0 0 0  Number falls in past yr: 0 0 0 0 0  Injury with Fall? 0 0 0 0 0  Risk for fall due to :     History of fall(s)  Follow up  Falls evaluation completed Falls evaluation completed       Functional Status Survey: Is the patient deaf or have  difficulty hearing?: No Does the patient have difficulty seeing, even when wearing glasses/contacts?: No Does the patient have difficulty concentrating, remembering, or making decisions?: No Does the patient have difficulty walking or climbing stairs?: No Does the patient have difficulty dressing or bathing?: No Does the patient have difficulty doing errands alone such as visiting a doctor's office or shopping?: No   Assessment & Plan  1. Annual physical exam Increase physical activity to 150 minutes a week - Lipid panel - CBC with Differential/Platelet - Comprehensive metabolic panel - Hemoglobin A1c  2. Hyperlipidemia, unspecified hyperlipidemia type  - Lipid panel - Comprehensive metabolic panel  3. Hypertension, unspecified type  - CBC with Differential/Platelet - Comprehensive metabolic panel  4. Obesity, Class III, BMI 40-49.9 (morbid obesity) (HCC) Increase physical activity to 150 minutes a week - Lipid panel - CBC with Differential/Platelet - Comprehensive metabolic panel - Hemoglobin A1c  5. Need for influenza vaccination  - Flu Vaccine QUAD 6+ mos PF IM (Fluarix Quad PF)  6. Screening for diabetes mellitus  - Comprehensive metabolic panel - Hemoglobin A1c  7. Encounter for screening mammogram for malignant neoplasm of breast  - MM 3D SCREEN BREAST BILATERAL; Future   -USPSTF grade A and B recommendations reviewed with patient; age-appropriate recommendations, preventive care, screening tests, etc discussed and encouraged; healthy living encouraged; see AVS for patient education given to patient -Discussed importance of 150 minutes of physical activity weekly, eat two servings of fish weekly, eat one serving of tree nuts ( cashews, pistachios, pecans, almonds.Marland Kitchen) every other day, eat 6 servings of fruit/vegetables daily and drink plenty of water and avoid sweet beverages.

## 2022-04-01 ENCOUNTER — Other Ambulatory Visit: Payer: Self-pay | Admitting: Nurse Practitioner

## 2022-04-01 DIAGNOSIS — D649 Anemia, unspecified: Secondary | ICD-10-CM

## 2022-04-01 LAB — CBC WITH DIFFERENTIAL/PLATELET
Basophils Absolute: 0.1 10*3/uL (ref 0.0–0.2)
Basos: 1 %
EOS (ABSOLUTE): 0.7 10*3/uL — ABNORMAL HIGH (ref 0.0–0.4)
Eos: 8 %
Hematocrit: 33.8 % — ABNORMAL LOW (ref 34.0–46.6)
Hemoglobin: 10 g/dL — ABNORMAL LOW (ref 11.1–15.9)
Immature Grans (Abs): 0 10*3/uL (ref 0.0–0.1)
Immature Granulocytes: 0 %
Lymphocytes Absolute: 3.5 10*3/uL — ABNORMAL HIGH (ref 0.7–3.1)
Lymphs: 39 %
MCH: 22.6 pg — ABNORMAL LOW (ref 26.6–33.0)
MCHC: 29.6 g/dL — ABNORMAL LOW (ref 31.5–35.7)
MCV: 77 fL — ABNORMAL LOW (ref 79–97)
Monocytes Absolute: 0.7 10*3/uL (ref 0.1–0.9)
Monocytes: 8 %
Neutrophils Absolute: 3.9 10*3/uL (ref 1.4–7.0)
Neutrophils: 44 %
Platelets: 328 10*3/uL (ref 150–450)
RBC: 4.42 x10E6/uL (ref 3.77–5.28)
RDW: 15.4 % (ref 11.7–15.4)
WBC: 8.9 10*3/uL (ref 3.4–10.8)

## 2022-04-01 LAB — COMPREHENSIVE METABOLIC PANEL
ALT: 19 IU/L (ref 0–32)
AST: 16 IU/L (ref 0–40)
Albumin/Globulin Ratio: 1.3 (ref 1.2–2.2)
Albumin: 4.3 g/dL (ref 3.8–4.9)
Alkaline Phosphatase: 99 IU/L (ref 44–121)
BUN/Creatinine Ratio: 18 (ref 9–23)
BUN: 14 mg/dL (ref 6–24)
Bilirubin Total: 0.3 mg/dL (ref 0.0–1.2)
CO2: 24 mmol/L (ref 20–29)
Calcium: 9.3 mg/dL (ref 8.7–10.2)
Chloride: 102 mmol/L (ref 96–106)
Creatinine, Ser: 0.8 mg/dL (ref 0.57–1.00)
Globulin, Total: 3.3 g/dL (ref 1.5–4.5)
Glucose: 104 mg/dL — ABNORMAL HIGH (ref 70–99)
Potassium: 4.8 mmol/L (ref 3.5–5.2)
Sodium: 143 mmol/L (ref 134–144)
Total Protein: 7.6 g/dL (ref 6.0–8.5)
eGFR: 89 mL/min/{1.73_m2} (ref >59)

## 2022-04-01 LAB — HEMOGLOBIN A1C
Est. average glucose Bld gHb Est-mCnc: 120 mg/dL
Hgb A1c MFr Bld: 5.8 % — ABNORMAL HIGH (ref 4.8–5.6)

## 2022-04-01 LAB — LIPID PANEL
Chol/HDL Ratio: 5.6 ratio — ABNORMAL HIGH (ref 0.0–4.4)
Cholesterol, Total: 208 mg/dL — ABNORMAL HIGH (ref 100–199)
HDL: 37 mg/dL — ABNORMAL LOW (ref >39)
LDL Chol Calc (NIH): 147 mg/dL — ABNORMAL HIGH (ref 0–99)
Triglycerides: 130 mg/dL (ref 0–149)
VLDL Cholesterol Cal: 24 mg/dL (ref 5–40)

## 2022-04-03 ENCOUNTER — Other Ambulatory Visit: Payer: Self-pay | Admitting: Nurse Practitioner

## 2022-04-03 DIAGNOSIS — D649 Anemia, unspecified: Secondary | ICD-10-CM

## 2022-04-03 MED ORDER — IRON (FERROUS SULFATE) 325 (65 FE) MG PO TABS: 325.0000 mg | ORAL_TABLET | Freq: Every day | ORAL | 5 refills | Status: DC

## 2022-07-02 ENCOUNTER — Ambulatory Visit
Admission: RE | Admit: 2022-07-02 | Discharge: 2022-07-02 | Disposition: A | Discharge status: Home / Self Care | Payer: Managed Care, Other (non HMO) | Source: Ambulatory Visit | Attending: Nurse Practitioner | Admitting: Nurse Practitioner

## 2022-07-02 DIAGNOSIS — Z1231 Encounter for screening mammogram for malignant neoplasm of breast: Secondary | ICD-10-CM | POA: Diagnosis present

## 2022-07-18 ENCOUNTER — Encounter: Payer: Self-pay | Admitting: Family Medicine

## 2022-07-18 ENCOUNTER — Ambulatory Visit: Payer: Managed Care, Other (non HMO) | Admitting: Family Medicine

## 2022-07-18 VITALS — BP 132/80 | HR 94 | Temp 98.4°F | Resp 16 | Ht 63.0 in | Wt 249.7 lb

## 2022-07-18 DIAGNOSIS — F341 Dysthymic disorder: Secondary | ICD-10-CM

## 2022-07-18 DIAGNOSIS — N951 Menopausal and female climacteric states: Secondary | ICD-10-CM

## 2022-07-18 DIAGNOSIS — Z8049 Family history of malignant neoplasm of other genital organs: Secondary | ICD-10-CM

## 2022-07-18 DIAGNOSIS — R5383 Other fatigue: Secondary | ICD-10-CM

## 2022-07-18 DIAGNOSIS — N921 Excessive and frequent menstruation with irregular cycle: Secondary | ICD-10-CM | POA: Diagnosis not present

## 2022-07-18 MED ORDER — DULOXETINE HCL 30 MG PO CPEP: 30.0000 mg | ORAL_CAPSULE | Freq: Every day | ORAL | 0 refills | Status: DC

## 2022-07-18 NOTE — Addendum Note (Signed)
Addended by: Carlene Coria on: 07/18/2022 02:38 PM   Modules accepted: Orders

## 2022-07-18 NOTE — Progress Notes (Signed)
Name: Judy Robinson   MRN: [id number]    DOB: 01-07-1971   Date:07/18/2022       Progress Note  Subjective  Chief Complaint  Chief Complaint  Patient presents with   Menstrual Problem    Constant x2 weeks    HPI  Irregular cycles: over the past year she started to skip cycles, she had two cycles in Nov, two in Dec and the LMP started on 12/29, it was heavy up to today, she decided to come in since this has been the longest she has ever bled. She is feeling fatigued, no SOB. She has a personal history of iron deficiency anemia and is already taking iron pills. Bleeding today is light   Dysthmia: she states going on for the past 6 months, phq is high. Her mother was diagnosed with cancer in May 2023 - uterine cancer - she is also struggling at work, productive is low, she is worrying about different things. Feels overwhelmed. She denies crying .   Patient Active Problem List   Diagnosis Date Noted   Hyperlipidemia 04/10/2021   Personal history of colonic polyps    Hypertension 02/03/2018   Benign neoplasm of sigmoid colon    Tennis Must Quervain's disease (radial styloid tenosynovitis) 02/09/2015   Acne inversa 02/09/2015   Obesity, Class III, BMI 40-49.9 (morbid obesity) ([place]) 02/09/2015    Past Surgical History:  Procedure Laterality Date   CESAREAN SECTION     COLONOSCOPY WITH PROPOFOL N/A 03/25/2016   Procedure: COLONOSCOPY WITH PROPOFOL;  Surgeon: Lucilla Lame, MD;  Location: [facility] ENDOSCOPY;  Service: Endoscopy;  Laterality: N/A;   COLONOSCOPY WITH PROPOFOL N/A 05/03/2020   Procedure: COLONOSCOPY WITH PROPOFOL;  Surgeon: Lucilla Lame, MD;  Location: [facility]COPY;  Service: Endoscopy;  Laterality: N/A;   TUBAL LIGATION      Family History  Problem Relation Age of Onset   Hypertension Mother    Thyroid disease Mother    Diabetes Brother    Heart attack Brother    Heart disease Father 14       heart attack   Breast cancer Paternal Aunt    Asthma Maternal Grandmother     Cancer Maternal Grandfather        prostate?   Heart attack Paternal Grandmother     Social History   Tobacco Use   Smoking status: Former    Packs/day: 0.25    Years: 1.00    Total pack years: 0.25    Types: Cigarettes    Quit date: 01/30/2011    Years since quitting: 11.4   Smokeless tobacco: Never  Substance Use Topics   Alcohol use: Yes    Alcohol/week: 4.0 - 7.0 standard drinks of alcohol    Types: 4 - 7 Standard drinks or equivalent per week    Comment: ocassional     Current Outpatient Medications:    Iron, Ferrous Sulfate, 325 (65 Fe) MG TABS, Take 325 mg by mouth daily., Disp: 90 tablet, Rfl: 5  No Known Allergies  I personally reviewed active problem list, medication list, allergies, family history, social history, health maintenance with the patient/caregiver today.   ROS  Ten systems reviewed and is negative except as mentioned in HPI   Objective  Vitals:   07/18/22 1411  BP: 132/80  Pulse: 94  Resp: 16  Temp: 98.4 F (36.9 C)  TempSrc: Oral  SpO2: 99%  Weight: 249 lb 11.2 oz (113.3 kg)  Height: '5\' 3"'$  (1.6 m)    Body  mass index is 44.23 kg/m.  Physical Exam Constitutional: Patient appears well-developed and well-nourished. Obese  No distress.  HEENT: head atraumatic, normocephalic, pupils equal and reactive to light, neck supple Pelvic: normal external genitalia, scant amount of bleeding on cervix, no polyps, normal bimanual exam  Cardiovascular: Normal rate, regular rhythm and normal heart sounds.  No murmur heard. No BLE edema. Pulmonary/Chest: Effort normal and breath sounds normal. No respiratory distress. Abdominal: Soft.  There is no tenderness. Psychiatric: Patient has a normal mood and affect. behavior is normal. Judgment and thought content normal.   PHQ2/9:    07/18/2022    2:13 PM 03/31/2022    8:42 AM 10/14/2021    1:18 PM 07/12/2021    1:11 PM 03/29/2021    2:45 PM  Depression screen PHQ 2/9  Decreased Interest 2 0 0 0 0   Down, Depressed, Hopeless 0 0 0 0 0  PHQ - 2 Score 2 0 0 0 0  Altered sleeping 3 0   0  Tired, decreased energy 3 0   0  Change in appetite 3 0   0  Feeling bad or failure about yourself  1 0   0  Trouble concentrating 1 0   0  Moving slowly or fidgety/restless 0 0   0  Suicidal thoughts 0 0   0  PHQ-9 Score 13 0   0  Difficult doing work/chores Somewhat difficult Not difficult at all   Not difficult at all    phq 9 is positive - she states symptoms positive for the past 6 months    Fall Risk:    07/18/2022    2:13 PM 03/31/2022    8:42 AM 10/14/2021    1:17 PM 07/12/2021    1:10 PM 03/29/2021    2:44 PM  Fall Risk   Falls in the past year? 0 0 0 0 0  Number falls in past yr:  0 0 0 0  Injury with Fall?  0 0 0 0  Risk for fall due to : No Fall Risks      Follow up Education provided;Falls prevention discussed;Falls evaluation completed  Falls evaluation completed Falls evaluation completed       Functional Status Survey: Is the patient deaf or have difficulty hearing?: No Does the patient have difficulty seeing, even when wearing glasses/contacts?: No Does the patient have difficulty concentrating, remembering, or making decisions?: Yes Does the patient have difficulty walking or climbing stairs?: No Does the patient have difficulty dressing or bathing?: No Does the patient have difficulty doing errands alone such as visiting a doctor's office or shopping?: No    Assessment & Plan  1. Menorrhagia with irregular cycle  - CBC with Differential/Platelet - Iron, TIBC and Ferritin Panel - TSH - COMPLETE METABOLIC PANEL WITH GFR - Ambulatory referral to Obstetrics / Gynecology  History of tubal ligation   2. Other fatigue  - CBC with Differential/Platelet - Iron, TIBC and Ferritin Panel - TSH - COMPLETE METABOLIC PANEL WITH GFR  3. Peri-menopause  - Ambulatory referral to Obstetrics / Gynecology  4. Dysthymia  - DULoxetine (CYMBALTA) 30 MG capsule; Take 1  capsule (30 mg total) by mouth daily.  Dispense: 30 capsule; Refill: 0  5. Family history of uterine cancer  Mother  diagnosed last year

## 2022-07-19 LAB — CBC WITH DIFFERENTIAL/PLATELET
Basophils Absolute: 0.1 10*3/uL (ref 0.0–0.2)
Basos: 1 %
EOS (ABSOLUTE): 0.4 10*3/uL (ref 0.0–0.4)
Eos: 5 %
Hematocrit: 29.3 % — ABNORMAL LOW (ref 34.0–46.6)
Hemoglobin: 9.6 g/dL — ABNORMAL LOW (ref 11.1–15.9)
Immature Grans (Abs): 0 10*3/uL (ref 0.0–0.1)
Immature Granulocytes: 1 %
Lymphocytes Absolute: 3.1 10*3/uL (ref 0.7–3.1)
Lymphs: 35 %
MCH: 28.8 pg (ref 26.6–33.0)
MCHC: 32.8 g/dL (ref 31.5–35.7)
MCV: 88 fL (ref 79–97)
Monocytes Absolute: 0.6 10*3/uL (ref 0.1–0.9)
Monocytes: 7 %
Neutrophils Absolute: 4.6 10*3/uL (ref 1.4–7.0)
Neutrophils: 51 %
Platelets: 329 10*3/uL (ref 150–450)
RBC: 3.33 x10E6/uL — ABNORMAL LOW (ref 3.77–5.28)
RDW: 15 % (ref 11.7–15.4)
WBC: 8.9 10*3/uL (ref 3.4–10.8)

## 2022-07-19 LAB — IRON,TIBC AND FERRITIN PANEL
Ferritin: 21 ng/mL (ref 15–150)
Iron Saturation: 59 % — ABNORMAL HIGH (ref 15–55)
Iron: 219 ug/dL — ABNORMAL HIGH (ref 27–159)
Total Iron Binding Capacity: 372 ug/dL (ref 250–450)
UIBC: 153 ug/dL (ref 131–425)

## 2022-07-19 LAB — COMPREHENSIVE METABOLIC PANEL
ALT: 17 IU/L (ref 0–32)
AST: 18 IU/L (ref 0–40)
Albumin/Globulin Ratio: 1.4 (ref 1.2–2.2)
Albumin: 4.2 g/dL (ref 3.8–4.9)
Alkaline Phosphatase: 88 IU/L (ref 44–121)
BUN/Creatinine Ratio: 17 (ref 9–23)
BUN: 12 mg/dL (ref 6–24)
Bilirubin Total: 0.2 mg/dL (ref 0.0–1.2)
CO2: 23 mmol/L (ref 20–29)
Calcium: 9.4 mg/dL (ref 8.7–10.2)
Chloride: 104 mmol/L (ref 96–106)
Creatinine, Ser: 0.71 mg/dL (ref 0.57–1.00)
Globulin, Total: 2.9 g/dL (ref 1.5–4.5)
Glucose: 91 mg/dL (ref 70–99)
Potassium: 4.6 mmol/L (ref 3.5–5.2)
Sodium: 140 mmol/L (ref 134–144)
Total Protein: 7.1 g/dL (ref 6.0–8.5)
eGFR: 102 mL/min/{1.73_m2} (ref >59)

## 2022-07-19 LAB — TSH: TSH: 1.47 u[IU]/mL (ref 0.450–4.500)

## 2022-07-21 LAB — IRON AND TIBC
Iron Saturation: 7 % — CL (ref 15–55)
Iron: 32 ug/dL (ref 27–159)
Total Iron Binding Capacity: 457 ug/dL — ABNORMAL HIGH (ref 250–450)
UIBC: 425 ug/dL (ref 131–425)

## 2022-07-21 LAB — SPECIMEN STATUS REPORT

## 2022-07-21 LAB — FERRITIN: Ferritin: 11 ng/mL — ABNORMAL LOW (ref 15–150)

## 2022-08-13 ENCOUNTER — Ambulatory Visit (INDEPENDENT_AMBULATORY_CARE_PROVIDER_SITE_OTHER): Payer: Managed Care, Other (non HMO) | Admitting: Obstetrics and Gynecology

## 2022-08-13 ENCOUNTER — Encounter: Payer: Self-pay | Admitting: Obstetrics and Gynecology

## 2022-08-13 VITALS — BP 160/90 | HR 73 | Ht 63.0 in | Wt 250.2 lb

## 2022-08-13 DIAGNOSIS — N921 Excessive and frequent menstruation with irregular cycle: Secondary | ICD-10-CM

## 2022-08-13 DIAGNOSIS — N926 Irregular menstruation, unspecified: Secondary | ICD-10-CM

## 2022-08-13 DIAGNOSIS — Z7689 Persons encountering health services in other specified circumstances: Secondary | ICD-10-CM

## 2022-08-13 MED ORDER — MEDROXYPROGESTERONE ACETATE 5 MG PO TABS: 5.0000 mg | ORAL_TABLET | Freq: Every day | ORAL | 1 refills | Status: DC

## 2022-08-13 NOTE — Progress Notes (Signed)
Patient presents today to discuss menorrhagia. She states over the last year her cycles have become extended, heavier and causing severe cramping. Patient is not on any type of birth control at this time, history of BTL. No additional concerns.

## 2022-08-13 NOTE — Progress Notes (Signed)
HPI:      [name] is a 52 y.o. [id number] who LMP was Patient's last menstrual period was 08/11/2022 (exact date).  Subjective:   She presents today because over the last 6 months she has described her menses is getting heavier.  She reports that sometimes she bleeds 2 weeks/month.  The are somewhat irregular but it is a cramping and heavy bleeding she does not like.  She reports that she does have a history of fibroids in her family but has never been diagnosed with them herself.  She has a tubal ligation for birth control.    Hx: The following portions of the patient's history were reviewed and updated as appropriate:             She  has a past medical history of Essential hypertension, benign (02/03/2018), Obesity, Class III, BMI 40-49.9 (morbid obesity) ([place]) (02/09/2015), and Torn meniscus. She does not have any pertinent problems on file. She  has a past surgical history that includes Cesarean section; Tubal ligation; Colonoscopy with propofol (N/A, 03/25/2016); and Colonoscopy with propofol (N/A, 05/03/2020). Her family history includes Asthma in her maternal grandmother; Breast cancer in her paternal aunt; Cancer in her maternal grandfather; Diabetes in her brother; Heart attack in her brother and paternal grandmother; Heart disease (age of onset: 20) in her father; Hypertension in her mother; Thyroid disease in her mother. She  reports that she quit smoking about 11 years ago. Her smoking use included cigarettes. She has a 0.25 pack-year smoking history. She has never used smokeless tobacco. She reports current alcohol use of about 4.0 - 7.0 standard drinks of alcohol per week. She reports that she does not use drugs. She has a current medication list which includes the following prescription(s): duloxetine, iron (ferrous sulfate), and medroxyprogesterone. She has No Known Allergies.       Review of Systems:  Review of Systems  Constitutional: Denied constitutional symptoms,  night sweats, recent illness, fatigue, fever, insomnia and weight loss.  Eyes: Denied eye symptoms, eye pain, photophobia, vision change and visual disturbance.  Ears/Nose/Throat/Neck: Denied ear, nose, throat or neck symptoms, hearing loss, nasal discharge, sinus congestion and sore throat.  Cardiovascular: Denied cardiovascular symptoms, arrhythmia, chest pain/pressure, edema, exercise intolerance, orthopnea and palpitations.  Respiratory: Denied pulmonary symptoms, asthma, pleuritic pain, productive sputum, cough, dyspnea and wheezing.  Gastrointestinal: Denied, gastro-esophageal reflux, melena, nausea and vomiting.  Genitourinary: See HPI for additional information.  Musculoskeletal: Denied musculoskeletal symptoms, stiffness, swelling, muscle weakness and myalgia.  Dermatologic: Denied dermatology symptoms, rash and scar.  Neurologic: Denied neurology symptoms, dizziness, headache, neck pain and syncope.  Psychiatric: Denied psychiatric symptoms, anxiety and depression.  Endocrine: Denied endocrine symptoms including hot flashes and night sweats.   Meds:   Current Outpatient Medications on File Prior to Visit  Medication Sig Dispense Refill   DULoxetine (CYMBALTA) 30 MG capsule Take 1 capsule (30 mg total) by mouth daily. 30 capsule 0   Iron, Ferrous Sulfate, 325 (65 Fe) MG TABS Take 325 mg by mouth daily. 90 tablet 5   No current facility-administered medications on file prior to visit.      Objective:     Vitals:   08/13/22 1010 08/13/22 1021  BP: (!) 164/93 (!) 160/90  Pulse: 88 73   Filed Weights   08/13/22 1010  Weight: 250 lb 3.2 oz (113.5 kg)  Assessment:    G2P2002 Patient Active Problem List   Diagnosis Date Noted   Hyperlipidemia 04/10/2021   Personal history of colonic polyps    Hypertension 02/03/2018   Benign neoplasm of sigmoid colon    Tennis Must Quervain's disease (radial styloid tenosynovitis) 02/09/2015   Acne inversa 02/09/2015    Obesity, Class III, BMI 40-49.9 (morbid obesity) ([place]) 02/09/2015     1. Establishing care with new doctor, encounter for   2. Irregular menstrual cycle   3. Menorrhagia with irregular cycle     Patient having regular cycles but they are quite heavy.  Very near menopausal based on age   Plan:            65.  Attempt to control her menses with hormonal control.  This may temporize her until she reaches menopause.  Will for start with Provera 5 mg 2 weeks on 2 weeks off.  If bleeding becomes irregular or not controlled consider endometrial biopsy and ultrasound. I discussed this in detail with her strategy and rationale discussed. Orders No orders of the defined types were placed in this encounter.    Meds ordered this encounter  Medications   medroxyPROGESTERone (PROVERA) 5 MG tablet    Sig: Take 1 tablet (5 mg total) by mouth daily. Take for 2 weeks, then stop for 2 weeks then repeat. First day February 19    Dispense:  50 tablet    Refill:  1      F/U  Return in about 3 months (around 11/11/2022). I spent 31 minutes involved in the care of this patient preparing to see the patient by obtaining and reviewing her medical history (including labs, imaging tests and prior procedures), documenting clinical information in the electronic health record (EHR), counseling and coordinating care plans, writing and sending prescriptions, ordering tests or procedures and in direct communicating with the patient and medical staff discussing pertinent items from her history and physical exam.  Finis Bud, M.D. 08/13/2022 10:39 AM

## 2022-08-14 NOTE — Progress Notes (Signed)
BP 134/78   Pulse 89   Resp 16   Ht 5' 3"$  (1.6 m)   Wt 250 lb 1.6 oz (113.4 kg)   LMP 08/11/2022 (Exact Date)   SpO2 98%   BMI 44.30 kg/m    Subjective:    Patient ID: Judy Robinson, female    DOB: 1971-01-28, 52 y.o.   MRN: [id number]  HPI: Judy Robinson is a 52 y.o. female  Chief Complaint  Patient presents with   Follow-up    1 month recheck   Menorrhagia / Irregular cycles:  patient saw Dr. Ancil Boozer on 07/18/2022 with a complaint of her cycles.  She said over the last year she started to skip cycles.  She had 2 cycles in November, 2 in December. Her last menstrual period was 08/11/2022. Labs were done and referred her to GYN.  Saw GYN 08/13/2022.  They prescribed her  Provera. She will start that on 08/25/2022. She  Follows-up with them in May.   Dysthmia: Saw Dr. Ancil Boozer on 07/18/2022 she reported that over the past 6 months her mother was diagnosed with cancer in May 2023/uterine cancer.  Also been struggling at work she has low productivity has been worrying about different things and feels overwhelmed.  Started on Cymbalta 30 mg daily.  Today patient reports she is doing better. Her PHQ9 and GAD scores have improved.  She says she feels like her mood will improve after get her cycles under control.  She says she would like to stay on her current dose of cymbalta.      08/15/2022    3:09 PM 08/15/2022    3:08 PM 07/18/2022    2:13 PM 03/31/2022    8:42 AM 10/14/2021    1:18 PM  Depression screen PHQ 2/9  Decreased Interest 0 0 2 0 0  Down, Depressed, Hopeless 1 0 0 0 0  PHQ - 2 Score 1 0 2 0 0  Altered sleeping 2  3 0   Tired, decreased energy 2  3 0   Change in appetite 0  3 0   Feeling bad or failure about yourself  0  1 0   Trouble concentrating 0  1 0   Moving slowly or fidgety/restless 0  0 0   Suicidal thoughts 0  0 0   PHQ-9 Score 5  13 0   Difficult doing work/chores Somewhat difficult  Somewhat difficult Not difficult at all        08/15/2022    3:11 PM  07/18/2022    2:13 PM  GAD 7 : Generalized Anxiety Score  Nervous, Anxious, on Edge 0 0  Control/stop worrying 0 0  Worry too much - different things 0 0  Trouble relaxing 1 1  Restless 0 0  Easily annoyed or irritable 0 1  Afraid - awful might happen 0 0  Total GAD 7 Score 1 2  Anxiety Difficulty Not difficult at all Not difficult at all    Relevant past medical, surgical, family and social history reviewed and updated as indicated. Interim medical history since our last visit reviewed. Allergies and medications reviewed and updated.  Review of Systems  Constitutional: Negative for fever or weight change.  Respiratory: Negative for cough and shortness of breath.   Cardiovascular: Negative for chest pain or palpitations.  Gastrointestinal: Negative for abdominal pain, no bowel changes.  Musculoskeletal: Negative for gait problem or joint swelling.  Skin: Negative for rash.  Neurological: Negative for dizziness  or headache.  No other specific complaints in a complete review of systems (except as listed in HPI above).      Objective:    BP 134/78   Pulse 89   Resp 16   Ht 5' 3"$  (1.6 m)   Wt 250 lb 1.6 oz (113.4 kg)   LMP 08/11/2022 (Exact Date)   SpO2 98%   BMI 44.30 kg/m   Wt Readings from Last 3 Encounters:  08/15/22 250 lb 1.6 oz (113.4 kg)  08/13/22 250 lb 3.2 oz (113.5 kg)  07/18/22 249 lb 11.2 oz (113.3 kg)    Physical Exam  Constitutional: Patient appears well-developed and well-nourished. Obese  No distress.  HEENT: head atraumatic, normocephalic, pupils equal and reactive to light, neck supple Cardiovascular: Normal rate, regular rhythm and normal heart sounds.  No murmur heard. No BLE edema. Pulmonary/Chest: Effort normal and breath sounds normal. No respiratory distress. Abdominal: Soft.  There is no tenderness. Psychiatric: Patient has a normal mood and affect. behavior is normal. Judgment and thought content normal.   Results for orders placed or  performed in visit on 07/18/22  CBC with Differential/Platelet  Result Value Ref Range   WBC 8.9 3.4 - 10.8 x10E3/uL   RBC 3.33 (L) 3.77 - 5.28 x10E6/uL   Hemoglobin 9.6 (L) 11.1 - 15.9 g/dL   Hematocrit 29.3 (L) 34.0 - 46.6 %   MCV 88 79 - 97 fL   MCH 28.8 26.6 - 33.0 pg   MCHC 32.8 31.5 - 35.7 g/dL   RDW 15.0 11.7 - 15.4 %   Platelets 329 150 - 450 x10E3/uL   Neutrophils 51 Not Estab. %   Lymphs 35 Not Estab. %   Monocytes 7 Not Estab. %   Eos 5 Not Estab. %   Basos 1 Not Estab. %   Neutrophils Absolute 4.6 1.4 - 7.0 x10E3/uL   Lymphocytes Absolute 3.1 0.7 - 3.1 x10E3/uL   Monocytes Absolute 0.6 0.1 - 0.9 x10E3/uL   EOS (ABSOLUTE) 0.4 0.0 - 0.4 x10E3/uL   Basophils Absolute 0.1 0.0 - 0.2 x10E3/uL   Immature Granulocytes 1 Not Estab. %   Immature Grans (Abs) 0.0 0.0 - 0.1 x10E3/uL  Iron, TIBC and Ferritin Panel  Result Value Ref Range   Total Iron Binding Capacity 372 250 - 450 ug/dL   UIBC 153 131 - 425 ug/dL   Iron 219 (H) 27 - 159 ug/dL   Iron Saturation 59 (H) 15 - 55 %   Ferritin 21 15 - 150 ng/mL  TSH  Result Value Ref Range   TSH 1.470 0.450 - 4.500 uIU/mL  Comprehensive metabolic panel  Result Value Ref Range   Glucose 91 70 - 99 mg/dL   BUN 12 6 - 24 mg/dL   Creatinine, Ser 0.71 0.57 - 1.00 mg/dL   eGFR 102 >59 mL/min/1.73   BUN/Creatinine Ratio 17 9 - 23   Sodium 140 134 - 144 mmol/L   Potassium 4.6 3.5 - 5.2 mmol/L   Chloride 104 96 - 106 mmol/L   CO2 23 20 - 29 mmol/L   Calcium 9.4 8.7 - 10.2 mg/dL   Total Protein 7.1 6.0 - 8.5 g/dL   Albumin 4.2 3.8 - 4.9 g/dL   Globulin, Total 2.9 1.5 - 4.5 g/dL   Albumin/Globulin Ratio 1.4 1.2 - 2.2   Bilirubin Total 0.2 0.0 - 1.2 mg/dL   Alkaline Phosphatase 88 44 - 121 IU/L   AST 18 0 - 40 IU/L   ALT 17 0 -  32 IU/L      Assessment & Plan:   Problem List Items Addressed This Visit       Other   Menorrhagia with irregular cycle - Primary    continue to follow up with GYN, plan is to start Provera on 2/19        Dysthymia    continue cymbalta 30 mg daily.       Relevant Medications   DULoxetine (CYMBALTA) 30 MG capsule     Follow up plan: Return in about 6 months (around 02/13/2023) for follow up.

## 2022-08-15 ENCOUNTER — Other Ambulatory Visit: Payer: Self-pay

## 2022-08-15 ENCOUNTER — Ambulatory Visit: Payer: Managed Care, Other (non HMO) | Admitting: Nurse Practitioner

## 2022-08-15 ENCOUNTER — Encounter: Payer: Self-pay | Admitting: Nurse Practitioner

## 2022-08-15 VITALS — BP 134/78 | HR 89 | Resp 16 | Ht 63.0 in | Wt 250.1 lb

## 2022-08-15 DIAGNOSIS — F341 Dysthymic disorder: Secondary | ICD-10-CM | POA: Diagnosis not present

## 2022-08-15 DIAGNOSIS — N921 Excessive and frequent menstruation with irregular cycle: Secondary | ICD-10-CM | POA: Diagnosis not present

## 2022-08-15 MED ORDER — DULOXETINE HCL 30 MG PO CPEP: 30.0000 mg | ORAL_CAPSULE | Freq: Every day | ORAL | 1 refills | Status: DC

## 2022-08-15 NOTE — Assessment & Plan Note (Signed)
continue cymbalta 30 mg daily.

## 2022-08-15 NOTE — Assessment & Plan Note (Signed)
continue to follow up with GYN, plan is to start Provera on 2/19

## 2022-12-10 ENCOUNTER — Encounter: Payer: Self-pay | Admitting: Obstetrics and Gynecology

## 2022-12-10 ENCOUNTER — Telehealth: Payer: Managed Care, Other (non HMO) | Admitting: Obstetrics and Gynecology

## 2022-12-10 DIAGNOSIS — N926 Irregular menstruation, unspecified: Secondary | ICD-10-CM

## 2022-12-10 DIAGNOSIS — N921 Excessive and frequent menstruation with irregular cycle: Secondary | ICD-10-CM

## 2022-12-10 MED ORDER — MEDROXYPROGESTERONE ACETATE 5 MG PO TABS: 5.0000 mg | ORAL_TABLET | Freq: Every day | ORAL | 1 refills | Status: DC

## 2022-12-10 NOTE — Progress Notes (Addendum)
Virtual Visit via Video Note  I connected with Judy Robinson on 01/19/23 at  7:55 AM EDT by video and verified that I was speaking with the correct person using two identifiers.    [name] is a 52 y.o. [phone] who LMP was No LMP recorded. I discussed the limitations, risks, security and privacy concerns of performing an evaluation and management service by video and the availability of in person appointments. I also discussed with the patient that there may be a patient responsible charge related to this service. The patient expressed understanding and agreed to proceed.  Location of patient:  Home  Patient gave explicit verbal consent for video visit:  YES  Location of provider:  [facility] office  Persons other than physician and patient involved in provider conference:  None   Subjective:   History of Present Illness:    She presents today she states that taking the progesterone pills for 2 weeks of every month has made a great deal of difference in her menstrual periods she says they are much lighter and more manageable.  She has now run out of pills and in May she did not have a menstrual period.  She denies any other problems and feels well.  Her desire would be to go back on the progesterone pills to keep her periods well-regulated.   Hx: The following portions of the patient's history were reviewed and updated as appropriate:             She  has a past medical history of Essential hypertension, benign (02/03/2018), Obesity, Class III, BMI 40-49.9 (morbid obesity) (HCC) (02/09/2015), and Torn meniscus. She does not have any pertinent problems on file. She  has a past surgical history that includes Cesarean section; Tubal ligation; Colonoscopy with propofol (N/A, 03/25/2016); and Colonoscopy with propofol (N/A, 05/03/2020). Her family history includes Asthma in her maternal grandmother; Breast cancer in her paternal aunt; Cancer in her maternal grandfather; Diabetes in  her brother; Heart attack in her brother and paternal grandmother; Heart disease (age of onset: 78) in her father; Hypertension in her mother; Thyroid disease in her mother. She  reports that she quit smoking about 11 years ago. Her smoking use included cigarettes. She started smoking about 12 years ago. She has a 0.3 pack-year smoking history. She has never used smokeless tobacco. She reports current alcohol use of about 4.0 - 7.0 standard drinks of alcohol per week. She reports that she does not use drugs. She has a current medication list which includes the following prescription(s): duloxetine, iron (ferrous sulfate), and medroxyprogesterone. She has No Known Allergies.       Review of Systems:  Review of Systems  Constitutional: Denied constitutional symptoms, night sweats, recent illness, fatigue, fever, insomnia and weight loss.  Eyes: Denied eye symptoms, eye pain, photophobia, vision change and visual disturbance.  Ears/Nose/Throat/Neck: Denied ear, nose, throat or neck symptoms, hearing loss, nasal discharge, sinus congestion and sore throat.  Cardiovascular: Denied cardiovascular symptoms, arrhythmia, chest pain/pressure, edema, exercise intolerance, orthopnea and palpitations.  Respiratory: Denied pulmonary symptoms, asthma, pleuritic pain, productive sputum, cough, dyspnea and wheezing.  Gastrointestinal: Denied, gastro-esophageal reflux, melena, nausea and vomiting.  Genitourinary: Denied genitourinary symptoms including symptomatic vaginal discharge, pelvic relaxation issues, and urinary complaints.  Musculoskeletal: Denied musculoskeletal symptoms, stiffness, swelling, muscle weakness and myalgia.  Dermatologic: Denied dermatology symptoms, rash and scar.  Neurologic: Denied neurology symptoms, dizziness, headache, neck pain and syncope.  Psychiatric: Denied psychiatric symptoms, anxiety and  depression.  Endocrine: Denied endocrine symptoms including hot flashes and night sweats.    Meds:   Current Outpatient Medications on File Prior to Visit  Medication Sig Dispense Refill   DULoxetine (CYMBALTA) 30 MG capsule Take 1 capsule (30 mg total) by mouth daily. 90 capsule 1   Iron, Ferrous Sulfate, 325 (65 Fe) MG TABS Take 325 mg by mouth daily. 90 tablet 5   No current facility-administered medications on file prior to visit.    Assessment:    G2P2002 Patient Active Problem List   Diagnosis Date Noted   Menorrhagia with irregular cycle 08/15/2022   Dysthymia 08/15/2022   Hyperlipidemia 04/10/2021   Personal history of colonic polyps    Hypertension 02/03/2018   Benign neoplasm of sigmoid colon    Tommi Rumps Quervain's disease (radial styloid tenosynovitis) 02/09/2015   Acne inversa 02/09/2015   Obesity, Class III, BMI 40-49.9 (morbid obesity) (HCC) 02/09/2015       1. Menorrhagia with irregular cycle   2. Irregular menstrual cycle     Patient did well regulated with progesterone.  She desires a continuation of this medication regimen.   Plan:            1.  Continue progesterone regimen for 6 months.  2.  If she continues to have menses on this medication she is obviously not in menopause yet. Orders No orders of the defined types were placed in this encounter.    Meds ordered this encounter  Medications   medroxyPROGESTERone (PROVERA) 5 MG tablet    Sig: Take 1 tablet (5 mg total) by mouth daily. Take for 2 weeks, then stop for 2 weeks then repeat. First day February 19    Dispense:  50 tablet    Refill:  1      F/U  Return in about 6 months (around 06/11/2023).  I spent 21 minutes involved in the care of this patient preparing to see the patient by obtaining and reviewing her medical history (including labs, imaging tests and prior procedures), documenting clinical information in the electronic health record (EHR), counseling and coordinating care plans, writing and sending prescriptions, ordering tests or procedures and in direct communicating with  the patient and medical staff discussing pertinent items from her history and physical exam.  Elonda Husky, M.D. 01/19/2023 7:35 AM

## 2023-02-20 ENCOUNTER — Encounter: Payer: Self-pay | Admitting: Nurse Practitioner

## 2023-02-20 ENCOUNTER — Other Ambulatory Visit: Payer: Self-pay | Admitting: Nurse Practitioner

## 2023-02-20 ENCOUNTER — Ambulatory Visit: Payer: Managed Care, Other (non HMO) | Admitting: Nurse Practitioner

## 2023-02-20 ENCOUNTER — Other Ambulatory Visit: Payer: Self-pay

## 2023-02-20 VITALS — BP 118/76 | HR 91 | Temp 98.6°F | Resp 16 | Ht 63.0 in | Wt 243.3 lb

## 2023-02-20 DIAGNOSIS — F341 Dysthymic disorder: Secondary | ICD-10-CM | POA: Diagnosis not present

## 2023-02-20 DIAGNOSIS — D509 Iron deficiency anemia, unspecified: Secondary | ICD-10-CM | POA: Diagnosis not present

## 2023-02-20 DIAGNOSIS — Z131 Encounter for screening for diabetes mellitus: Secondary | ICD-10-CM

## 2023-02-20 DIAGNOSIS — R7303 Prediabetes: Secondary | ICD-10-CM

## 2023-02-20 DIAGNOSIS — E785 Hyperlipidemia, unspecified: Secondary | ICD-10-CM | POA: Diagnosis not present

## 2023-02-20 MED ORDER — DULOXETINE HCL 30 MG PO CPEP: 30.0000 mg | ORAL_CAPSULE | Freq: Every day | ORAL | 1 refills | Status: DC

## 2023-02-20 NOTE — Assessment & Plan Note (Signed)
Continue cymbalta 30mg daily.  

## 2023-02-20 NOTE — Assessment & Plan Note (Signed)
Continue working on lifestyle modification.  Getting labs

## 2023-02-20 NOTE — Assessment & Plan Note (Signed)
Continue working on lifestyle modification. She has lost 7 lbs since last visit.

## 2023-02-20 NOTE — Assessment & Plan Note (Signed)
History of iron deficiency, period has improved with progesterone.  Will check levels.

## 2023-02-20 NOTE — Progress Notes (Signed)
BP 118/76   Pulse 91   Temp 98.6 F (37 C) (Oral)   Resp 16   Ht 5\' 3"  (1.6 m)   Wt 243 lb 4.8 oz (110.4 kg)   SpO2 99%   BMI 43.10 kg/m    Subjective:    Patient ID: Judy Robinson, female    DOB: 02/11/1971, 52 y.o.   MRN: [id number]  HPI: Judy Robinson is a 52 y.o. female  Chief Complaint  Patient presents with   Depression   Dysthymia  Medication cymbalta 30 mg daily Compliant yes Side effects no PHQ9 positive GAD negative Patient reports she is doing well, will continue with current treatment plan    02/20/2023    2:51 PM 02/20/2023    2:49 PM 08/15/2022    3:09 PM 08/15/2022    3:08 PM 07/18/2022    2:13 PM  Depression screen PHQ 2/9  Decreased Interest 0 0 0 0 2  Down, Depressed, Hopeless 0 0 1 0 0  PHQ - 2 Score 0 0 1 0 2  Altered sleeping 1  2  3   Tired, decreased energy 1  2  3   Change in appetite 3  0  3  Feeling bad or failure about yourself  0  0  1  Trouble concentrating 0  0  1  Moving slowly or fidgety/restless 0  0  0  Suicidal thoughts 0  0  0  PHQ-9 Score 5  5  13   Difficult doing work/chores Not difficult at all  Somewhat difficult  Somewhat difficult       02/20/2023    2:52 PM 08/15/2022    3:11 PM 07/18/2022    2:13 PM  GAD 7 : Generalized Anxiety Score  Nervous, Anxious, on Edge 0 0 0  Control/stop worrying 0 0 0  Worry too much - different things 0 0 0  Trouble relaxing 0 1 1  Restless 0 0 0  Easily annoyed or irritable 0 0 1  Afraid - awful might happen 0 0 0  Total GAD 7 Score 0 1 2  Anxiety Difficulty Not difficult at all Not difficult at all Not difficult at all    HLD:  -Medications: none -working on lifestyle modification -Last lipid panel:  Lipid Panel     Component Value Date/Time   CHOL 208 (H) 03/31/2022 1626   TRIG 130 03/31/2022 1626   HDL 37 (L) 03/31/2022 1626   CHOLHDL 5.6 (H) 03/31/2022 1626   LDLCALC 147 (H) 03/31/2022 1626   LABVLDL 24 03/31/2022 1626   The 10-year ASCVD risk score (Arnett  DK, et al., 2019) is: 3.2%   Values used to calculate the score:     Age: 72 years     Sex: Female     Is Non-Hispanic African American: Yes     Diabetic: No     Tobacco smoker: No     Systolic Blood Pressure: 118 mmHg     Is BP treated: No     HDL Cholesterol: 37 mg/dL     Total Cholesterol: 208 mg/dL   Obesity:  Current weight : 243 lbs BMI: 43.10 previous weight:250 lbs Treatment Tried: lifestyle modification Comorbidities: HLD   Relevant past medical, surgical, family and social history reviewed and updated as indicated. Interim medical history since our last visit reviewed. Allergies and medications reviewed and updated.  Review of Systems  Constitutional: Negative for fever or weight change.  Respiratory: Negative for cough and  shortness of breath.   Cardiovascular: Negative for chest pain or palpitations.  Gastrointestinal: Negative for abdominal pain, no bowel changes.  Musculoskeletal: Negative for gait problem or joint swelling.  Skin: Negative for rash.  Neurological: Negative for dizziness or headache.  No other specific complaints in a complete review of systems (except as listed in HPI above).      Objective:    BP 118/76   Pulse 91   Temp 98.6 F (37 C) (Oral)   Resp 16   Ht 5\' 3"  (1.6 m)   Wt 243 lb 4.8 oz (110.4 kg)   SpO2 99%   BMI 43.10 kg/m   Wt Readings from Last 3 Encounters:  02/20/23 243 lb 4.8 oz (110.4 kg)  08/15/22 250 lb 1.6 oz (113.4 kg)  08/13/22 250 lb 3.2 oz (113.5 kg)    Physical Exam  Constitutional: Patient appears well-developed and well-nourished. Obese  No distress.  HEENT: head atraumatic, normocephalic, pupils equal and reactive to light, neck supple, throat within normal limits Cardiovascular: Normal rate, regular rhythm and normal heart sounds.  No murmur heard. No BLE edema. Pulmonary/Chest: Effort normal and breath sounds normal. No respiratory distress. Abdominal: Soft.  There is no tenderness. Psychiatric: Patient  has a normal mood and affect. behavior is normal. Judgment and thought content normal.  Results for orders placed or performed in visit on 07/18/22  CBC with Differential/Platelet  Result Value Ref Range   WBC 8.9 3.4 - 10.8 x10E3/uL   RBC 3.33 (L) 3.77 - 5.28 x10E6/uL   Hemoglobin 9.6 (L) 11.1 - 15.9 g/dL   Hematocrit 19.1 (L) 47.8 - 46.6 %   MCV 88 79 - 97 fL   MCH 28.8 26.6 - 33.0 pg   MCHC 32.8 31.5 - 35.7 g/dL   RDW 29.5 62.1 - 30.8 %   Platelets 329 150 - 450 x10E3/uL   Neutrophils 51 Not Estab. %   Lymphs 35 Not Estab. %   Monocytes 7 Not Estab. %   Eos 5 Not Estab. %   Basos 1 Not Estab. %   Neutrophils Absolute 4.6 1.4 - 7.0 x10E3/uL   Lymphocytes Absolute 3.1 0.7 - 3.1 x10E3/uL   Monocytes Absolute 0.6 0.1 - 0.9 x10E3/uL   EOS (ABSOLUTE) 0.4 0.0 - 0.4 x10E3/uL   Basophils Absolute 0.1 0.0 - 0.2 x10E3/uL   Immature Granulocytes 1 Not Estab. %   Immature Grans (Abs) 0.0 0.0 - 0.1 x10E3/uL  Iron, TIBC and Ferritin Panel  Result Value Ref Range   Total Iron Binding Capacity 372 250 - 450 ug/dL   UIBC 657 846 - 962 ug/dL   Iron 952 (H) 27 - 841 ug/dL   Iron Saturation 59 (H) 15 - 55 %   Ferritin 21 15 - 150 ng/mL  TSH  Result Value Ref Range   TSH 1.470 0.450 - 4.500 uIU/mL  Comprehensive metabolic panel  Result Value Ref Range   Glucose 91 70 - 99 mg/dL   BUN 12 6 - 24 mg/dL   Creatinine, Ser 3.24 0.57 - 1.00 mg/dL   eGFR 401 >02 VO/ZDG/6.44   BUN/Creatinine Ratio 17 9 - 23   Sodium 140 134 - 144 mmol/L   Potassium 4.6 3.5 - 5.2 mmol/L   Chloride 104 96 - 106 mmol/L   CO2 23 20 - 29 mmol/L   Calcium 9.4 8.7 - 10.2 mg/dL   Total Protein 7.1 6.0 - 8.5 g/dL   Albumin 4.2 3.8 - 4.9 g/dL   Globulin, Total  2.9 1.5 - 4.5 g/dL   Albumin/Globulin Ratio 1.4 1.2 - 2.2   Bilirubin Total 0.2 0.0 - 1.2 mg/dL   Alkaline Phosphatase 88 44 - 121 IU/L   AST 18 0 - 40 IU/L   ALT 17 0 - 32 IU/L      Assessment & Plan:   Problem List Items Addressed This Visit        Other   Obesity, Class III, BMI 40-49.9 (morbid obesity) (HCC)    Continue working on lifestyle modification. She has lost 7 lbs since last visit.       Hyperlipidemia    Continue working on lifestyle modification.  Getting labs      Relevant Orders   Lipid panel   Dysthymia - Primary    Continue cymbalta 30 mg daily      Relevant Medications   DULoxetine (CYMBALTA) 30 MG capsule   Iron deficiency anemia    History of iron deficiency, period has improved with progesterone.  Will check levels.       Relevant Orders   CBC with Differential/Platelet   Iron, TIBC and Ferritin Panel   Other Visit Diagnoses     Screening for diabetes mellitus       Relevant Orders   Hemoglobin A1c   Prediabetes       will check labs   Relevant Orders   Hemoglobin A1c   Comprehensive metabolic panel        Follow up plan: Return in about 6 months (around 08/23/2023) for follow up.

## 2023-02-21 LAB — CBC WITH DIFFERENTIAL/PLATELET
Basophils Absolute: 0.1 10*3/uL (ref 0.0–0.2)
Basos: 1 %
EOS (ABSOLUTE): 0.5 10*3/uL — ABNORMAL HIGH (ref 0.0–0.4)
Eos: 5 %
Hematocrit: 35 % (ref 34.0–46.6)
Hemoglobin: 10.7 g/dL — ABNORMAL LOW (ref 11.1–15.9)
Immature Grans (Abs): 0 10*3/uL (ref 0.0–0.1)
Immature Granulocytes: 0 %
Lymphocytes Absolute: 3.1 10*3/uL (ref 0.7–3.1)
Lymphs: 33 %
MCH: 24.2 pg — ABNORMAL LOW (ref 26.6–33.0)
MCHC: 30.6 g/dL — ABNORMAL LOW (ref 31.5–35.7)
MCV: 79 fL (ref 79–97)
Monocytes Absolute: 0.7 10*3/uL (ref 0.1–0.9)
Monocytes: 7 %
Neutrophils Absolute: 5 10*3/uL (ref 1.4–7.0)
Neutrophils: 54 %
Platelets: 356 10*3/uL (ref 150–450)
RBC: 4.43 x10E6/uL (ref 3.77–5.28)
RDW: 15.3 % (ref 11.7–15.4)
WBC: 9.3 10*3/uL (ref 3.4–10.8)

## 2023-02-21 LAB — COMPREHENSIVE METABOLIC PANEL
ALT: 16 IU/L (ref 0–32)
AST: 16 IU/L (ref 0–40)
Albumin: 4.3 g/dL (ref 3.8–4.9)
Alkaline Phosphatase: 94 IU/L (ref 44–121)
BUN/Creatinine Ratio: 13 (ref 9–23)
BUN: 11 mg/dL (ref 6–24)
Bilirubin Total: 0.3 mg/dL (ref 0.0–1.2)
CO2: 22 mmol/L (ref 20–29)
Calcium: 9.5 mg/dL (ref 8.7–10.2)
Chloride: 105 mmol/L (ref 96–106)
Creatinine, Ser: 0.88 mg/dL (ref 0.57–1.00)
Globulin, Total: 3.1 g/dL (ref 1.5–4.5)
Glucose: 97 mg/dL (ref 70–99)
Potassium: 4.5 mmol/L (ref 3.5–5.2)
Sodium: 141 mmol/L (ref 134–144)
Total Protein: 7.4 g/dL (ref 6.0–8.5)
eGFR: 79 mL/min/{1.73_m2} (ref >59)

## 2023-02-21 LAB — LIPID PANEL
Chol/HDL Ratio: 5.2 ratio — ABNORMAL HIGH (ref 0.0–4.4)
Cholesterol, Total: 196 mg/dL (ref 100–199)
HDL: 38 mg/dL — ABNORMAL LOW (ref >39)
LDL Chol Calc (NIH): 136 mg/dL — ABNORMAL HIGH (ref 0–99)
Triglycerides: 123 mg/dL (ref 0–149)
VLDL Cholesterol Cal: 22 mg/dL (ref 5–40)

## 2023-02-21 LAB — IRON,TIBC AND FERRITIN PANEL
Ferritin: 10 ng/mL — ABNORMAL LOW (ref 15–150)
Iron Saturation: 7 % — CL (ref 15–55)
Iron: 32 ug/dL (ref 27–159)
Total Iron Binding Capacity: 472 ug/dL — ABNORMAL HIGH (ref 250–450)
UIBC: 440 ug/dL — ABNORMAL HIGH (ref 131–425)

## 2023-02-21 LAB — HEMOGLOBIN A1C
Est. average glucose Bld gHb Est-mCnc: 123 mg/dL
Hgb A1c MFr Bld: 5.9 % — ABNORMAL HIGH (ref 4.8–5.6)

## 2023-02-23 ENCOUNTER — Other Ambulatory Visit: Payer: Self-pay | Admitting: Nurse Practitioner

## 2023-02-23 DIAGNOSIS — D508 Other iron deficiency anemias: Secondary | ICD-10-CM

## 2023-02-27 ENCOUNTER — Encounter: Payer: Self-pay | Admitting: Internal Medicine

## 2023-02-27 ENCOUNTER — Inpatient Hospital Stay: Payer: Managed Care, Other (non HMO) | Attending: Internal Medicine | Admitting: Internal Medicine

## 2023-02-27 VITALS — BP 164/92 | HR 77 | Temp 98.1°F | Ht 63.0 in | Wt 243.8 lb

## 2023-02-27 DIAGNOSIS — N939 Abnormal uterine and vaginal bleeding, unspecified: Secondary | ICD-10-CM | POA: Diagnosis not present

## 2023-02-27 DIAGNOSIS — E611 Iron deficiency: Secondary | ICD-10-CM | POA: Diagnosis not present

## 2023-02-27 DIAGNOSIS — D649 Anemia, unspecified: Secondary | ICD-10-CM | POA: Insufficient documentation

## 2023-02-27 DIAGNOSIS — Z79899 Other long term (current) drug therapy: Secondary | ICD-10-CM | POA: Insufficient documentation

## 2023-02-27 NOTE — Assessment & Plan Note (Addendum)
#   Anemia- Hb- AUG 2024-iron sat-7%; Ferritin -6;Hb-10- Mildly symptomatic.  Secondary to: History of heavy menstrual cycles.   # I discussed regarding IV iron infusion/Venofer.  Also discussed the potential acute infusion reactions with IV iron; which are quite rare.    # Patient currently only mildly symptomatic with mild fatigue.  Also she is not on oral iron.  Recommend taking oral iron.  However if not improved then recommend IV iron infusions.  #Etiology of iron deficiency: likely menorrhagia. Recommend CBC CMP LDH peripheral smear; haptoglobin; iron studies ferritin B12 folic acid; Urine analysis.   Thank you Dr.Pender for allowing me to participate in the care of your pleasant patient. Please do not hesitate to contact me with questions or concerns in the interim.  # DISPOSITION: # NO labs today # follow up 3 months- MD; possible venofer-1 week prior-[facility] - cbc/bmp;LDH retic count; haptoglobin;  iron studies ferritin B12 folic acid; Urine analysis.  Dr.B

## 2023-02-27 NOTE — Progress Notes (Signed)
Has some trouble sleeping, no sleep aid.  No questions or concerns today.

## 2023-02-27 NOTE — Progress Notes (Signed)
[facility] CONSULT NOTE  Patient Care Team: Berniece Salines, FNP as PCP - General (Nurse Practitioner) Earna Coder, MD as Consulting Physician (Oncology)  CHIEF COMPLAINTS/PURPOSE OF CONSULTATION: ANEMIA   HEMATOLOGY HISTORY  # ANEMIA[Hb; MCV-platelets- WBC; Iron sat; ferritin;  GFR- CT/US- ;  EGD- none/colonoscopy- 2017; and 2021 [Dr.Wohl]   Latest Reference Range & Units 02/20/23 15:46  Iron 27 - 159 ug/dL 32  UIBC 657 - 846 ug/dL 962 (H)  TIBC 952 - 841 ug/dL 324 (H)  Ferritin 15 - 150 ng/mL 10 (L)  Iron Saturation 15 - 55 % 7 (LL)  (LL): Data is critically low (H): Data is abnormally high (L): Data is abnormally low  HISTORY OF PRESENTING ILLNESS: Patient ambulating-independently.   Judy Robinson 52 y.o.  female pleasant patient was been referred to [place] for further evaluation of anemia.  Patient complains of on going fatigue. Not on oral iron.   Blood in stools: none Blood in urine:none Difficulty swallowing: none Change of bowel movement/constipation:none Prior blood transfusion: none Kidney/Liver disease:none Alcohol: wine.  Bariatric surgery: none  Vaginal bleeding: used to be heavy- currently on on progesterone- improve din last 2 months.  Prior evaluation with hematology: none Prior bone marrow biopsy: none Oral iron: not iron pills- no GI issues.  Prior IV iron infusions: none    Review of Systems  Constitutional:  Positive for malaise/fatigue. Negative for chills, diaphoresis, fever and weight loss.  HENT:  Negative for nosebleeds and sore throat.   Eyes:  Negative for double vision.  Respiratory:  Negative for cough, hemoptysis, sputum production, shortness of breath and wheezing.   Cardiovascular:  Negative for chest pain, palpitations, orthopnea and leg swelling.  Gastrointestinal:  Negative for abdominal pain, blood in stool, constipation, diarrhea, heartburn, melena, nausea and vomiting.  Genitourinary:  Negative for  dysuria, frequency and urgency.  Musculoskeletal:  Negative for back pain and joint pain.  Skin: Negative.  Negative for itching and rash.  Neurological:  Negative for dizziness, tingling, focal weakness, weakness and headaches.  Endo/Heme/Allergies:  Does not bruise/bleed easily.  Psychiatric/Behavioral:  Negative for depression. The patient is not nervous/anxious and does not have insomnia.    MEDICAL HISTORY:  Past Medical History:  Diagnosis Date   Essential hypertension, benign 02/03/2018   Obesity, Class III, BMI 40-49.9 (morbid obesity) (HCC) 02/09/2015   Torn meniscus     SURGICAL HISTORY: Past Surgical History:  Procedure Laterality Date   CESAREAN SECTION     COLONOSCOPY WITH PROPOFOL N/A 03/25/2016   Procedure: COLONOSCOPY WITH PROPOFOL;  Surgeon: Midge Minium, MD;  Location: [facility] ENDOSCOPY;  Service: Endoscopy;  Laterality: N/A;   COLONOSCOPY WITH PROPOFOL N/A 05/03/2020   Procedure: COLONOSCOPY WITH PROPOFOL;  Surgeon: Midge Minium, MD;  Location: [facility] ENDOSCOPY;  Service: Endoscopy;  Laterality: N/A;   TUBAL LIGATION      SOCIAL HISTORY: Social History   Socioeconomic History   Marital status: Married    Spouse name: Not on file   Number of children: Not on file   Years of education: Not on file   Highest education level: Not on file  Occupational History   Not on file  Tobacco Use   Smoking status: Former    Current packs/day: 0.00    Average packs/day: 0.3 packs/day for 1 year (0.3 ttl pk-yrs)    Types: Cigarettes    Start date: 01/29/2010    Quit date: 01/30/2011    Years since quitting: 12.0   Smokeless  tobacco: Never  Vaping Use   Vaping status: Never Used  Substance and Sexual Activity   Alcohol use: Yes    Alcohol/week: 4.0 - 7.0 standard drinks of alcohol    Types: 4 - 7 Standard drinks or equivalent per week    Comment: ocassional   Drug use: No   Sexual activity: Yes    Partners: Male    Birth control/protection: Surgical    Comment: BTL   Other Topics Concern   Not on file  Social History Narrative   Not on file   Social Determinants of Health   Financial Resource Strain: Low Risk  (03/31/2022)   Overall Financial Resource Strain (CARDIA)    Difficulty of Paying Living Expenses: Not hard at all  Food Insecurity: No Food Insecurity (03/31/2022)   Hunger Vital Sign    Worried About Running Out of Food in the Last Year: Never true    Ran Out of Food in the Last Year: Never true  Transportation Needs: No Transportation Needs (03/31/2022)   PRAPARE - Administrator, Civil Service (Medical): No    Lack of Transportation (Non-Medical): No  Physical Activity: Insufficiently Active (03/31/2022)   Exercise Vital Sign    Days of Exercise per Week: 2 days    Minutes of Exercise per Session: 10 min  Stress: Stress Concern Present (03/31/2022)   [employer] of Occupational Health - Occupational Stress Questionnaire    Feeling of Stress : To some extent  Social Connections: Moderately Isolated (03/31/2022)   Social Connection and Isolation Panel [NHANES]    Frequency of Communication with Friends and Family: More than three times a week    Frequency of Social Gatherings with Friends and Family: Once a week    Attends Religious Services: Never    Database administrator or Organizations: No    Attends Banker Meetings: Never    Marital Status: Married  Catering manager Violence: Not At Risk (03/31/2022)   Humiliation, Afraid, Rape, and Kick questionnaire    Fear of Current or Ex-Partner: No    Emotionally Abused: No    Physically Abused: No    Sexually Abused: No    FAMILY HISTORY: Family History  Problem Relation Age of Onset   Hypertension Mother    Thyroid disease Mother    Diabetes Brother    Heart attack Brother    Heart disease Father 37       heart attack   Breast cancer Paternal Aunt    Asthma Maternal Grandmother    Cancer Maternal Grandfather        prostate?   Heart attack  Paternal Grandmother     ALLERGIES:  has No Known Allergies.  MEDICATIONS:  Current Outpatient Medications  Medication Sig Dispense Refill   DULoxetine (CYMBALTA) 30 MG capsule Take 1 capsule (30 mg total) by mouth daily. 90 capsule 1   medroxyPROGESTERone (PROVERA) 5 MG tablet Take 1 tablet (5 mg total) by mouth daily. Take for 2 weeks, then stop for 2 weeks then repeat. First day February 19 50 tablet 1   Iron, Ferrous Sulfate, 325 (65 Fe) MG TABS Take 325 mg by mouth daily. (Patient not taking: Reported on 02/27/2023) 90 tablet 5   No current facility-administered medications for this visit.     PHYSICAL EXAMINATION:   Vitals:   02/27/23 1056  BP: (!) 164/92  Pulse: 77  Temp: 98.1 F (36.7 C)  SpO2: 100%   Filed Weights   02/27/23  1056  Weight: 243 lb 12.8 oz (110.6 kg)    Physical Exam Vitals and nursing note reviewed.  HENT:     Head: Normocephalic and atraumatic.     Mouth/Throat:     Pharynx: Oropharynx is clear.  Eyes:     Extraocular Movements: Extraocular movements intact.     Pupils: Pupils are equal, round, and reactive to light.  Cardiovascular:     Rate and Rhythm: Normal rate and regular rhythm.  Pulmonary:     Comments: Decreased breath sounds bilaterally.  Abdominal:     Palpations: Abdomen is soft.  Musculoskeletal:        General: Normal range of motion.     Cervical back: Normal range of motion.  Skin:    General: Skin is warm.  Neurological:     General: No focal deficit present.     Mental Status: She is alert and oriented to person, place, and time.  Psychiatric:        Behavior: Behavior normal.        Judgment: Judgment normal.      LABORATORY DATA:  I have reviewed the data as listed Lab Results  Component Value Date   WBC 9.3 02/20/2023   HGB 10.7 (L) 02/20/2023   HCT 35.0 02/20/2023   MCV 79 02/20/2023   PLT 356 02/20/2023   Recent Labs    03/31/22 1626 07/18/22 1449 02/20/23 1546  NA 143 140 141  K 4.8 4.6 4.5   CL 102 104 105  CO2 24 23 22   GLUCOSE 104* 91 97  BUN 14 12 11   CREATININE 0.80 0.71 0.88  CALCIUM 9.3 9.4 9.5  PROT 7.6 7.1 7.4  ALBUMIN 4.3 4.2 4.3  AST 16 18 16   ALT 19 17 16   ALKPHOS 99 88 94  BILITOT 0.3 0.2 0.3     No results found.  ASSESSMENT & PLAN:   Symptomatic anemia # Anemia- Hb- AUG 2024-iron sat-7%; Ferritin -6;Hb-10- Mildly symptomatic.  Secondary to: History of heavy menstrual cycles.   # I discussed regarding IV iron infusion/Venofer.  Also discussed the potential acute infusion reactions with IV iron; which are quite rare.    # Patient currently only mildly symptomatic with mild fatigue.  Also she is not on oral iron.  Recommend taking oral iron.  However if not improved then recommend IV iron infusions.  #Etiology of iron deficiency: likely menorrhagia. Recommend CBC CMP LDH peripheral smear; haptoglobin; iron studies ferritin B12 folic acid; Urine analysis.   Thank you Dr.Pender for allowing me to participate in the care of your pleasant patient. Please do not hesitate to contact me with questions or concerns in the interim.  # DISPOSITION: # NO labs today # follow up 3 months- MD; possible venofer-1 week prior-[facility] - cbc/bmp;LDH retic count; haptoglobin;  iron studies ferritin B12 folic acid; Urine analysis.  Dr.B    All questions were answered. The patient knows to call the clinic with any problems, questions or concerns.    Earna Coder, MD 02/27/2023 11:32 AM

## 2023-04-03 ENCOUNTER — Ambulatory Visit (INDEPENDENT_AMBULATORY_CARE_PROVIDER_SITE_OTHER): Payer: Managed Care, Other (non HMO) | Admitting: Nurse Practitioner

## 2023-04-03 ENCOUNTER — Other Ambulatory Visit: Payer: Self-pay

## 2023-04-03 ENCOUNTER — Encounter: Payer: Self-pay | Admitting: Nurse Practitioner

## 2023-04-03 VITALS — BP 122/70 | HR 78 | Temp 98.2°F | Resp 16 | Ht 63.0 in | Wt 233.9 lb

## 2023-04-03 DIAGNOSIS — Z1231 Encounter for screening mammogram for malignant neoplasm of breast: Secondary | ICD-10-CM | POA: Diagnosis not present

## 2023-04-03 DIAGNOSIS — Z23 Encounter for immunization: Secondary | ICD-10-CM | POA: Diagnosis not present

## 2023-04-03 DIAGNOSIS — Z0000 Encounter for general adult medical examination without abnormal findings: Secondary | ICD-10-CM

## 2023-04-28 IMAGING — MG MM DIGITAL DIAGNOSTIC UNILAT*R* W/ TOMO W/ CAD
7 series · 8 of 19 positions shown · non-contrast
Comparison: Previous exam(s).

CLINICAL DATA: Callback for RIGHT breast asymmetry

EXAM:
DIGITAL DIAGNOSTIC UNILATERAL RIGHT MAMMOGRAM WITH TOMOSYNTHESIS AND
CAD
TECHNIQUE: Right digital diagnostic mammography and breast tomosynthesis was
performed. The images were evaluated with computer-aided detection.

[R CC]
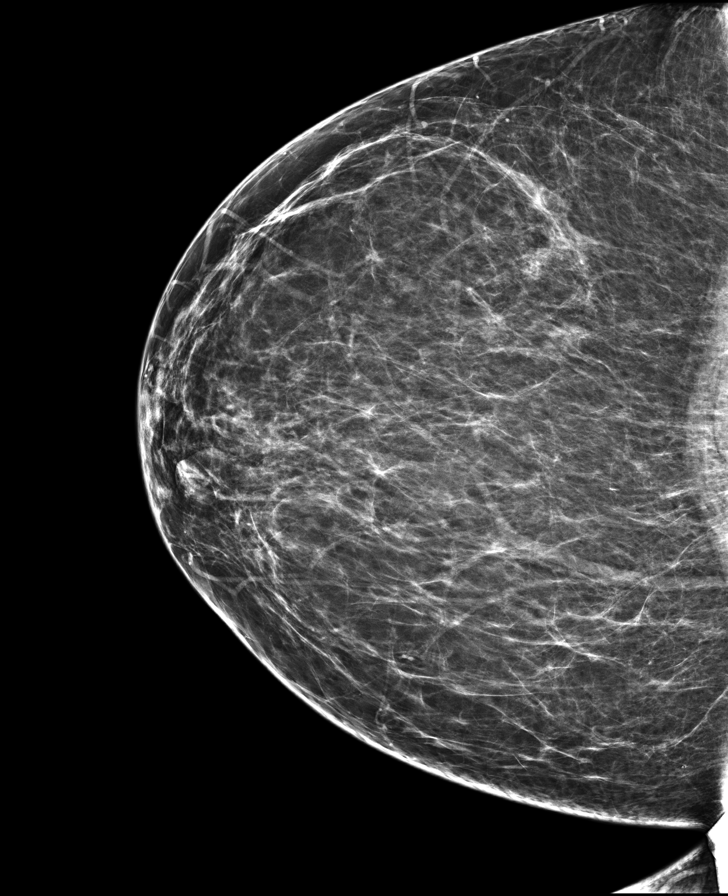

[R CC synth-2D]
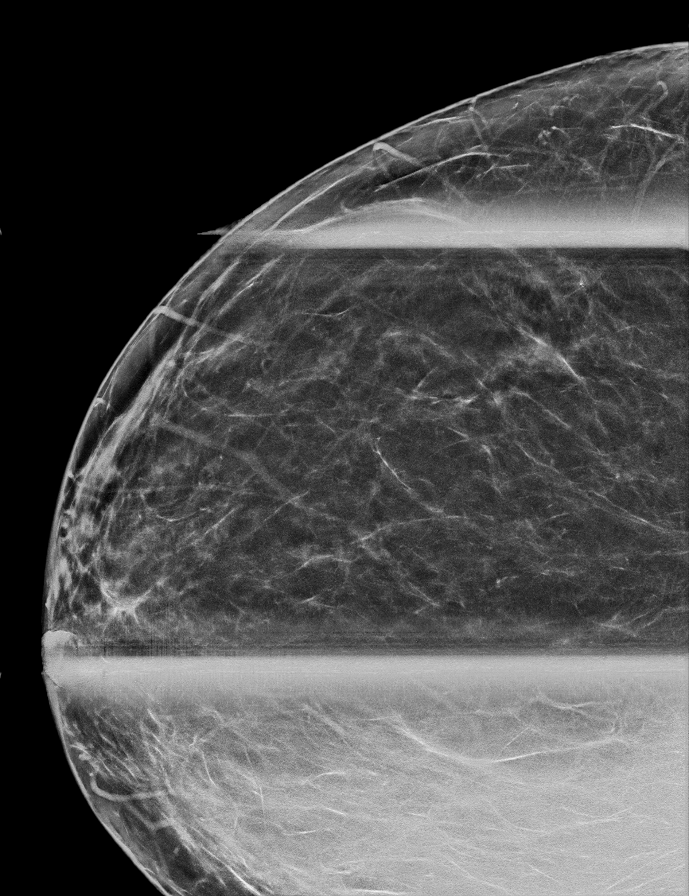

[R ML synth-2D]
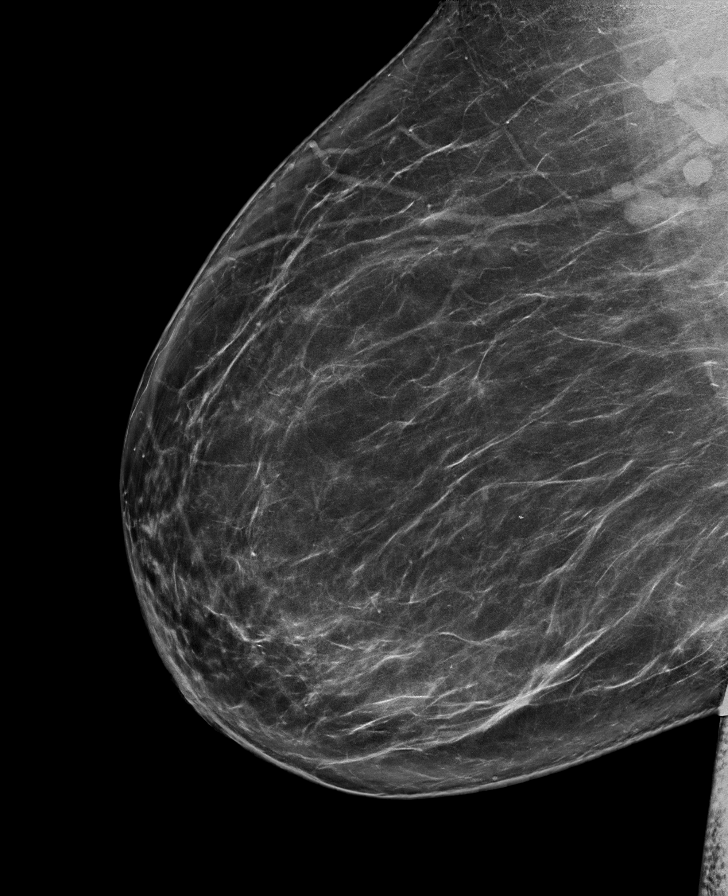

[R MLO synth-2D]
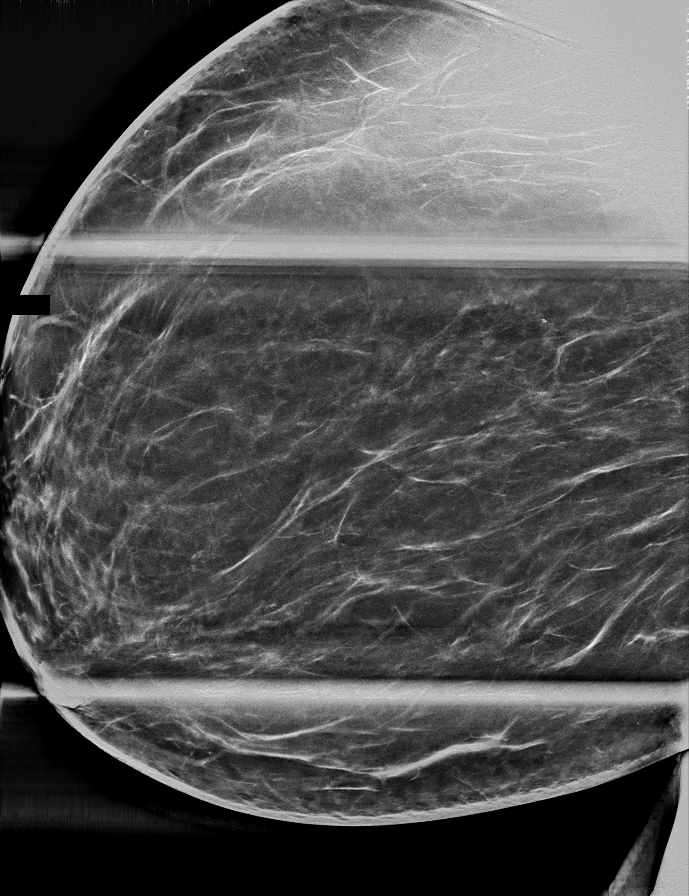

[R MLO tomo · 2 of 84 frames shown]
[frame 28/84]
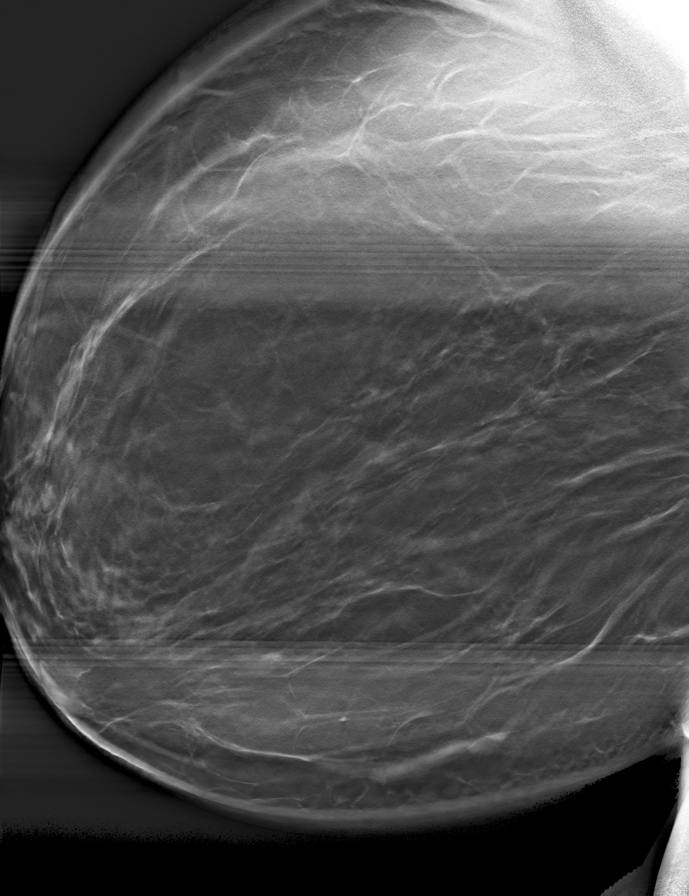
[frame 43/84]
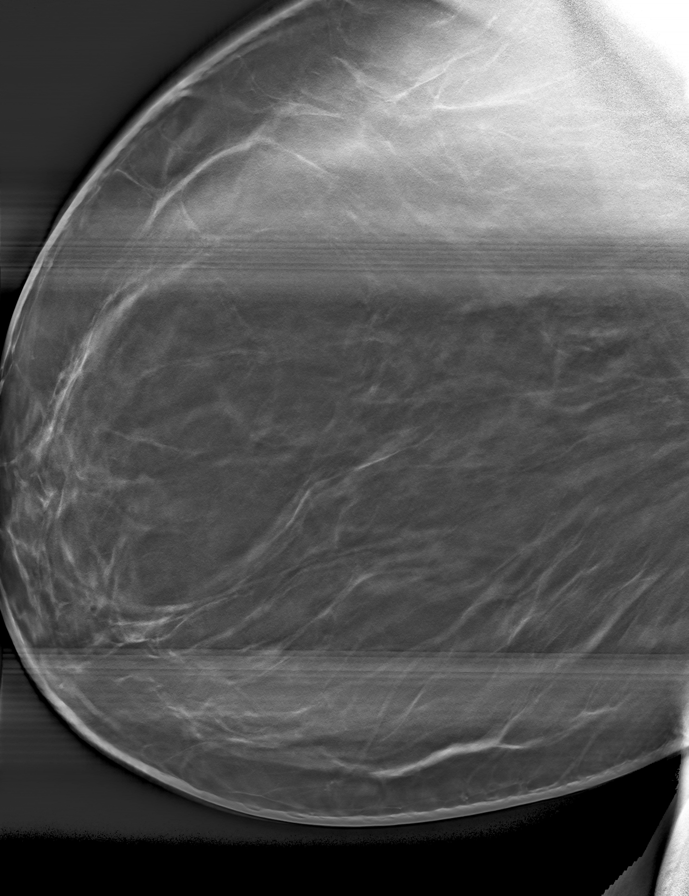

[R ML tomo · tomo slice 45/88.0]
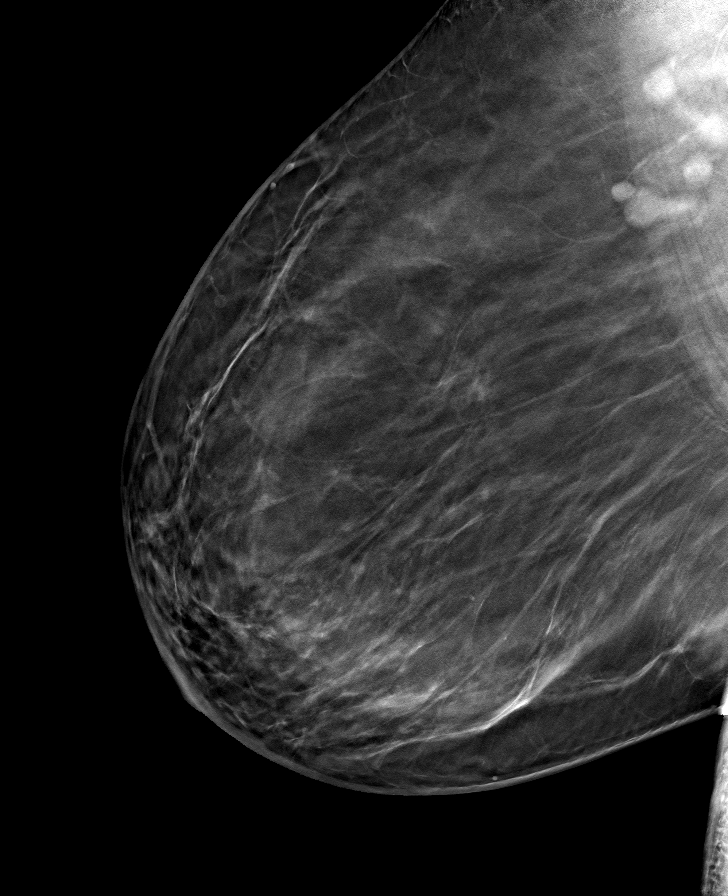

[R CC tomo · tomo slice 33/66.0]
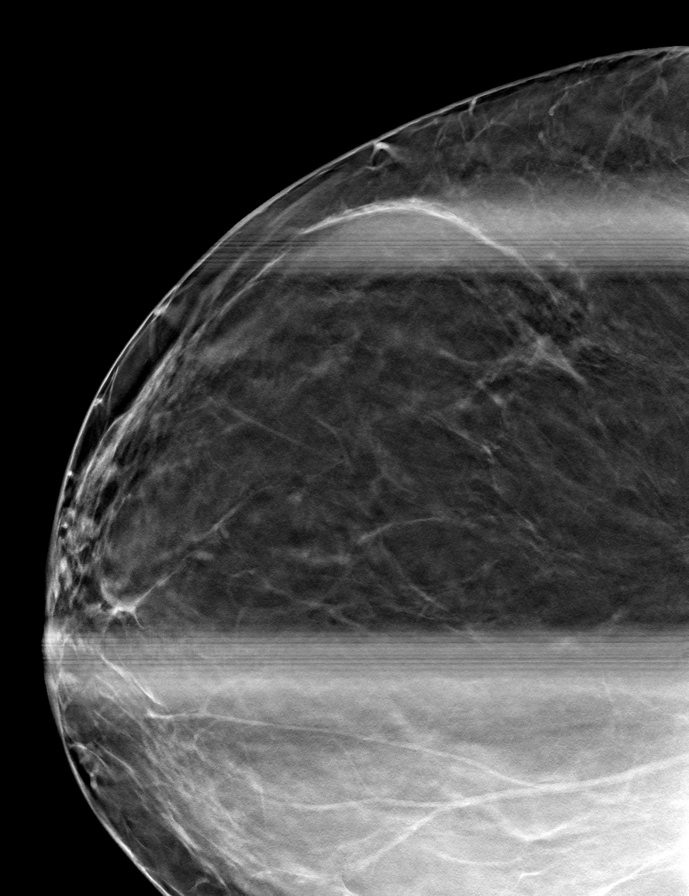

[8 of 19 positions shown; findings below may reference images not displayed]

ACR Breast Density Category b: There are scattered areas of
fibroglandular density.
FINDINGS: The previously described finding does not persist with additional
views, consistent with superimposed fibroglandular tissue.
Fibroglandular tissue assumes a configuration stable in comparison
to remote prior mammogram dating back to at least 9348 when
utilizing similar technique. No suspicious mass, microcalcification,
or other finding is identified.
IMPRESSION: No mammographic evidence of malignancy.

RECOMMENDATION:
Screening mammogram in one year.(Code:EG-L-A0N)

I have discussed the findings and recommendations with the patient.
If applicable, a reminder letter will be sent to the patient
regarding the next appointment.

BI-RADS CATEGORY  1: Negative.

## 2023-05-22 ENCOUNTER — Encounter: Payer: Self-pay | Admitting: Internal Medicine

## 2023-05-26 ENCOUNTER — Inpatient Hospital Stay: Payer: Managed Care, Other (non HMO) | Attending: Internal Medicine | Admitting: Nurse Practitioner

## 2023-05-26 ENCOUNTER — Encounter: Payer: Self-pay | Admitting: Nurse Practitioner

## 2023-05-26 ENCOUNTER — Inpatient Hospital Stay: Payer: Managed Care, Other (non HMO)

## 2023-05-26 VITALS — BP 157/83 | HR 64 | Temp 98.6°F | Resp 20 | Wt 243.5 lb

## 2023-05-26 DIAGNOSIS — D649 Anemia, unspecified: Secondary | ICD-10-CM | POA: Diagnosis not present

## 2023-05-26 DIAGNOSIS — D500 Iron deficiency anemia secondary to blood loss (chronic): Secondary | ICD-10-CM | POA: Diagnosis present

## 2023-05-26 DIAGNOSIS — Z87891 Personal history of nicotine dependence: Secondary | ICD-10-CM | POA: Insufficient documentation

## 2023-05-26 DIAGNOSIS — N920 Excessive and frequent menstruation with regular cycle: Secondary | ICD-10-CM | POA: Insufficient documentation

## 2023-05-26 DIAGNOSIS — D508 Other iron deficiency anemias: Secondary | ICD-10-CM

## 2023-05-26 NOTE — Progress Notes (Unsigned)
[facility] CONSULT NOTE  Patient Care Team: Berniece Salines, FNP as PCP - General (Nurse Practitioner) Earna Coder, MD as Consulting Physician (Oncology)  CHIEF COMPLAINTS/PURPOSE OF CONSULTATION: ANEMIA  HEMATOLOGY HISTORY  # ANEMIA [Hb; MCV-platelets- WBC; Iron sat; ferritin;  GFR- CT/US- ;  EGD- none/colonoscopy- 2017; and 2021 [Dr.Wohl]   Latest Reference Range & Units 02/20/23 15:46  Iron 27 - 159 ug/dL 32  UIBC 536 - 644 ug/dL 034 (H)  TIBC 742 - 595 ug/dL 638 (H)  Ferritin 15 - 150 ng/mL 10 (L)  Iron Saturation 15 - 55 % 7 (LL)  (LL): Data is critically low (H): Data is abnormally high (L): Data is abnormally low  HISTORY OF PRESENTING ILLNESS: Patient ambulating-independently.   Judy Robinson 52 y.o.  female pleasant patient was been referred to [place] for further evaluation of anemia.  Patient complains of on going fatigue. Not on oral iron.   Blood in stools: none Blood in urine:none Difficulty swallowing: none Change of bowel movement/constipation:none Prior blood transfusion: none Kidney/Liver disease:none Alcohol: wine.  Bariatric surgery: none  Vaginal bleeding: used to be heavy- currently on on progesterone- improve din last 2 months.  Prior evaluation with hematology: none Prior bone marrow biopsy: none Oral iron: not iron pills- no GI issues.  Prior IV iron infusions: none    Review of Systems  Constitutional:  Positive for malaise/fatigue. Negative for chills, diaphoresis, fever and weight loss.  HENT:  Negative for nosebleeds and sore throat.   Eyes:  Negative for double vision.  Respiratory:  Negative for cough, hemoptysis, sputum production, shortness of breath and wheezing.   Cardiovascular:  Negative for chest pain, palpitations, orthopnea and leg swelling.  Gastrointestinal:  Negative for abdominal pain, blood in stool, constipation, diarrhea, heartburn, melena, nausea and vomiting.  Genitourinary:  Negative for  dysuria, frequency and urgency.  Musculoskeletal:  Negative for back pain and joint pain.  Skin: Negative.  Negative for itching and rash.  Neurological:  Negative for dizziness, tingling, focal weakness, weakness and headaches.  Endo/Heme/Allergies:  Does not bruise/bleed easily.  Psychiatric/Behavioral:  Negative for depression. The patient is not nervous/anxious and does not have insomnia.    MEDICAL HISTORY:  Past Medical History:  Diagnosis Date   Essential hypertension, benign 02/03/2018   Obesity, Class III, BMI 40-49.9 (morbid obesity) (HCC) 02/09/2015   Torn meniscus     SURGICAL HISTORY: Past Surgical History:  Procedure Laterality Date   CESAREAN SECTION     COLONOSCOPY WITH PROPOFOL N/A 03/25/2016   Procedure: COLONOSCOPY WITH PROPOFOL;  Surgeon: Midge Minium, MD;  Location: [facility] ENDOSCOPY;  Service: Endoscopy;  Laterality: N/A;   COLONOSCOPY WITH PROPOFOL N/A 05/03/2020   Procedure: COLONOSCOPY WITH PROPOFOL;  Surgeon: Midge Minium, MD;  Location: [facility] ENDOSCOPY;  Service: Endoscopy;  Laterality: N/A;   TUBAL LIGATION      SOCIAL HISTORY: Social History   Socioeconomic History   Marital status: Married    Spouse name: Not on file   Number of children: Not on file   Years of education: Not on file   Highest education level: Not on file  Occupational History   Not on file  Tobacco Use   Smoking status: Former    Current packs/day: 0.00    Average packs/day: 0.3 packs/day for 1 year (0.3 ttl pk-yrs)    Types: Cigarettes    Start date: 01/29/2010    Quit date: 01/30/2011    Years since quitting: 12.3   Smokeless  tobacco: Never  Vaping Use   Vaping status: Never Used  Substance and Sexual Activity   Alcohol use: Yes    Alcohol/week: 4.0 - 7.0 standard drinks of alcohol    Types: 4 - 7 Standard drinks or equivalent per week    Comment: ocassional   Drug use: No   Sexual activity: Yes    Partners: Male    Birth control/protection: Surgical    Comment: BTL   Other Topics Concern   Not on file  Social History Narrative   Not on file   Social Determinants of Health   Financial Resource Strain: Low Risk  (04/03/2023)   Overall Financial Resource Strain (CARDIA)    Difficulty of Paying Living Expenses: Not hard at all  Food Insecurity: No Food Insecurity (04/03/2023)   Hunger Vital Sign    Worried About Running Out of Food in the Last Year: Never true    Ran Out of Food in the Last Year: Never true  Transportation Needs: No Transportation Needs (04/03/2023)   PRAPARE - Administrator, Civil Service (Medical): No    Lack of Transportation (Non-Medical): No  Physical Activity: Inactive (04/03/2023)   Exercise Vital Sign    Days of Exercise per Week: 0 days    Minutes of Exercise per Session: 0 min  Stress: No Stress Concern Present (04/03/2023)   [employer] of Occupational Health - Occupational Stress Questionnaire    Feeling of Stress : Only a little  Social Connections: Moderately Integrated (04/03/2023)   Social Connection and Isolation Panel [NHANES]    Frequency of Communication with Friends and Family: More than three times a week    Frequency of Social Gatherings with Friends and Family: More than three times a week    Attends Religious Services: More than 4 times per year    Active Member of [employer] or Organizations: No    Attends Banker Meetings: Never    Marital Status: Married  Catering manager Violence: Not At Risk (04/03/2023)   Humiliation, Afraid, Rape, and Kick questionnaire    Fear of Current or Ex-Partner: No    Emotionally Abused: No    Physically Abused: No    Sexually Abused: No    FAMILY HISTORY: Family History  Problem Relation Age of Onset   Hypertension Mother    Thyroid disease Mother    Diabetes Brother    Heart attack Brother    Heart disease Father 10       heart attack   Breast cancer Paternal Aunt    Asthma Maternal Grandmother    Cancer Maternal Grandfather         prostate?   Heart attack Paternal Grandmother     ALLERGIES:  has No Known Allergies.  MEDICATIONS:  Current Outpatient Medications  Medication Sig Dispense Refill   DULoxetine (CYMBALTA) 30 MG capsule Take 1 capsule (30 mg total) by mouth daily. 90 capsule 1   Iron, Ferrous Sulfate, 325 (65 Fe) MG TABS Take 325 mg by mouth daily. 90 tablet 5   medroxyPROGESTERone (PROVERA) 5 MG tablet Take 1 tablet (5 mg total) by mouth daily. Take for 2 weeks, then stop for 2 weeks then repeat. First day February 19 50 tablet 1   No current facility-administered medications for this visit.     PHYSICAL EXAMINATION:   Vitals:   05/26/23 1313  BP: (!) 157/83  Pulse: 64  Resp: 20  Temp: 98.6 F (37 C)  SpO2: 100%  Filed Weights   05/26/23 1313  Weight: 243 lb 8 oz (110.5 kg)    Physical Exam Vitals and nursing note reviewed.  HENT:     Head: Normocephalic and atraumatic.     Mouth/Throat:     Pharynx: Oropharynx is clear.  Eyes:     Extraocular Movements: Extraocular movements intact.     Pupils: Pupils are equal, round, and reactive to light.  Cardiovascular:     Rate and Rhythm: Normal rate and regular rhythm.  Pulmonary:     Comments: Decreased breath sounds bilaterally.  Abdominal:     Palpations: Abdomen is soft.  Musculoskeletal:        General: Normal range of motion.     Cervical back: Normal range of motion.  Skin:    General: Skin is warm.  Neurological:     General: No focal deficit present.     Mental Status: She is alert and oriented to person, place, and time.  Psychiatric:        Behavior: Behavior normal.        Judgment: Judgment normal.      LABORATORY DATA:  I have reviewed the data as listed Lab Results  Component Value Date   WBC 9.3 02/20/2023   HGB 10.7 (L) 02/20/2023   HCT 35.0 02/20/2023   MCV 79 02/20/2023   PLT 356 02/20/2023   Recent Labs    07/18/22 1449 02/20/23 1546  NA 140 141  K 4.6 4.5  CL 104 105  CO2 23 22  GLUCOSE 91  97  BUN 12 11  CREATININE 0.71 0.88  CALCIUM 9.4 9.5  PROT 7.1 7.4  ALBUMIN 4.2 4.3  AST 18 16  ALT 17 16  ALKPHOS 88 94  BILITOT 0.2 0.3     No results found.  ASSESSMENT & PLAN:   # Anemia- Hb- AUG 2024-iron sat-7%; Ferritin -6;Hb-10- Mildly symptomatic.  Secondary to: History of heavy menstrual cycles.    # I discussed regarding IV iron infusion/Venofer.  Also discussed the potential acute infusion reactions with IV iron; which are quite rare.     # Patient currently only mildly symptomatic with mild fatigue.  Also she is not on oral iron.  Recommend taking oral iron.  However if not improved then recommend IV iron infusions.   #Etiology of iron deficiency: likely menorrhagia. Recommend CBC CMP LDH peripheral smear; haptoglobin; iron studies ferritin B12 folic acid; Urine analysis.    Thank you Dr.Pender for allowing me to participate in the care of your pleasant patient. Please do not hesitate to contact me with questions or concerns in the interim.   # DISPOSITION: # NO labs today # follow up 3 months- MD; possible venofer-1 week prior-[facility] - cbc/bmp;LDH retic count; haptoglobin;  iron studies ferritin B12 folic acid; Urine analysis.  Dr.B  No problem-specific Assessment & Plan notes found for this encounter.     All questions were answered. The patient knows to call the clinic with any problems, questions or concerns.    Alinda Dooms, NP 05/26/2023 1:26 PM

## 2023-05-27 ENCOUNTER — Encounter: Payer: Self-pay | Admitting: Internal Medicine

## 2023-05-28 ENCOUNTER — Other Ambulatory Visit: Payer: Self-pay | Admitting: Nurse Practitioner

## 2023-05-28 ENCOUNTER — Encounter: Payer: Self-pay | Admitting: Nurse Practitioner

## 2023-05-28 DIAGNOSIS — D649 Anemia, unspecified: Secondary | ICD-10-CM

## 2023-05-28 MED ORDER — IRON (FERROUS SULFATE) 325 (65 FE) MG PO TABS: 325.0000 mg | ORAL_TABLET | Freq: Every day | ORAL | 5 refills | Status: DC

## 2023-06-08 ENCOUNTER — Encounter: Payer: Self-pay | Admitting: Obstetrics and Gynecology

## 2023-06-09 ENCOUNTER — Other Ambulatory Visit: Payer: Self-pay

## 2023-06-09 DIAGNOSIS — N921 Excessive and frequent menstruation with irregular cycle: Secondary | ICD-10-CM

## 2023-06-09 DIAGNOSIS — N926 Irregular menstruation, unspecified: Secondary | ICD-10-CM

## 2023-06-09 MED ORDER — MEDROXYPROGESTERONE ACETATE 5 MG PO TABS: 5.0000 mg | ORAL_TABLET | Freq: Every day | ORAL | 0 refills | Status: DC

## 2023-06-18 ENCOUNTER — Encounter: Payer: Self-pay | Admitting: Obstetrics and Gynecology

## 2023-06-18 ENCOUNTER — Ambulatory Visit: Payer: Managed Care, Other (non HMO) | Admitting: Obstetrics and Gynecology

## 2023-06-18 VITALS — BP 180/100 | HR 77 | Ht 63.0 in | Wt 246.8 lb

## 2023-06-18 DIAGNOSIS — N926 Irregular menstruation, unspecified: Secondary | ICD-10-CM

## 2023-06-18 DIAGNOSIS — N921 Excessive and frequent menstruation with irregular cycle: Secondary | ICD-10-CM

## 2023-06-18 MED ORDER — MEDROXYPROGESTERONE ACETATE 5 MG PO TABS
5.0000 mg | ORAL_TABLET | Freq: Every day | ORAL | 12 refills | Status: AC
Start: 2023-06-18 — End: ?

## 2023-06-18 NOTE — Progress Notes (Signed)
HPI:      [name] is a 52 y.o. [id number] who LMP was Patient's last menstrual period was 06/08/2023.  Subjective:   She presents today for follow-up of Provera use.  She was having very irregular cycles and she is of the age near menopause.  As a way to regulate her cycles and a presumptive test for pre versus post menopause she was begun on cyclic Provera.  She has been cycling every month on Provera.  She says her cycles are much more manageable and she is happy to know when they are coming.  She would like to stay on the Provera.    Hx: The following portions of the patient's history were reviewed and updated as appropriate:             She  has a past medical history of Essential hypertension, benign (02/03/2018), Obesity, Class III, BMI 40-49.9 (morbid obesity) (HCC) (02/09/2015), and Torn meniscus. She does not have any pertinent problems on file. She  has a past surgical history that includes Cesarean section; Tubal ligation; Colonoscopy with propofol (N/A, 03/25/2016); and Colonoscopy with propofol (N/A, 05/03/2020). Her family history includes Asthma in her maternal grandmother; Breast cancer in her paternal aunt; Cancer in her maternal grandfather; Diabetes in her brother; Heart attack in her brother and paternal grandmother; Heart disease (age of onset: 9) in her father; Hypertension in her mother; Thyroid disease in her mother. She  reports that she quit smoking about 12 years ago. Her smoking use included cigarettes. She started smoking about 13 years ago. She has a 0.3 pack-year smoking history. She has never used smokeless tobacco. She reports current alcohol use of about 4.0 - 7.0 standard drinks of alcohol per week. She reports that she does not use drugs. She has a current medication list which includes the following prescription(s): duloxetine, iron (ferrous sulfate), and medroxyprogesterone. She has no known allergies.       Review of Systems:  Review of  Systems  Constitutional: Denied constitutional symptoms, night sweats, recent illness, fatigue, fever, insomnia and weight loss.  Eyes: Denied eye symptoms, eye pain, photophobia, vision change and visual disturbance.  Ears/Nose/Throat/Neck: Denied ear, nose, throat or neck symptoms, hearing loss, nasal discharge, sinus congestion and sore throat.  Cardiovascular: Denied cardiovascular symptoms, arrhythmia, chest pain/pressure, edema, exercise intolerance, orthopnea and palpitations.  Respiratory: Denied pulmonary symptoms, asthma, pleuritic pain, productive sputum, cough, dyspnea and wheezing.  Gastrointestinal: Denied, gastro-esophageal reflux, melena, nausea and vomiting.  Genitourinary: Denied genitourinary symptoms including symptomatic vaginal discharge, pelvic relaxation issues, and urinary complaints.  Musculoskeletal: Denied musculoskeletal symptoms, stiffness, swelling, muscle weakness and myalgia.  Dermatologic: Denied dermatology symptoms, rash and scar.  Neurologic: Denied neurology symptoms, dizziness, headache, neck pain and syncope.  Psychiatric: Denied psychiatric symptoms, anxiety and depression.  Endocrine: Denied endocrine symptoms including hot flashes and night sweats.   Meds:   Current Outpatient Medications on File Prior to Visit  Medication Sig Dispense Refill   DULoxetine (CYMBALTA) 30 MG capsule Take 1 capsule (30 mg total) by mouth daily. 90 capsule 1   Iron, Ferrous Sulfate, 325 (65 Fe) MG TABS Take 325 mg by mouth daily. 90 tablet 5   No current facility-administered medications on file prior to visit.      Objective:     Vitals:   06/18/23 1050  BP: (!) 171/103  Pulse: 77   Filed Weights   06/18/23 1050  Weight: 246 lb 12.8 oz (111.9 kg)  Assessment:    G2P2002 Patient Active Problem List   Diagnosis Date Noted   Symptomatic anemia 02/27/2023   Iron deficiency anemia 02/20/2023   Menorrhagia with irregular cycle  08/15/2022   Dysthymia 08/15/2022   Hyperlipidemia 04/10/2021   History of colonic polyps    Benign neoplasm of sigmoid colon    Tommi Rumps Quervain's disease (radial styloid tenosynovitis) 02/09/2015   Acne inversa 02/09/2015   Obesity, Class III, BMI 40-49.9 (morbid obesity) (HCC) 02/09/2015     1. Menorrhagia with irregular cycle   2. Irregular menstrual cycle     Patient now having regular cycles on monthly Provera.  This proves that she is not menopausal.  She is much happier having regular cycles and knowing what to expect.   Plan:            1.  Continue Provera.  2.  Patient instructed that if she begins missing cycles she may be menopausal.  She will inform us if this happens.  3.  Noted to have possible new onset hypertension at this visit.  Patient strongly encouraged to see her primary care doctor in the next week for blood pressure check and follow-up for hypertension. Orders No orders of the defined types were placed in this encounter.    Meds ordered this encounter  Medications   medroxyPROGESTERone (PROVERA) 5 MG tablet    Sig: Take 1 tablet (5 mg total) by mouth daily. Take for 2 weeks, then stop for 2 weeks then repeat. First day February 19    Dispense:  14 tablet    Refill:  12      F/U  Return for Annual Physical. I spent 22 minutes involved in the care of this patient preparing to see the patient by obtaining and reviewing her medical history (including labs, imaging tests and prior procedures), documenting clinical information in the electronic health record (EHR), counseling and coordinating care plans, writing and sending prescriptions, ordering tests or procedures and in direct communicating with the patient and medical staff discussing pertinent items from her history and physical exam.  Elonda Husky, M.D. 06/18/2023 11:14 AM

## 2023-06-18 NOTE — Progress Notes (Signed)
Patient presents today to follow-up on Provera use. She states using it since [place], two weeks on and two weeks off. Reports a normal monthly cycle with moderate-severe cramping.

## 2023-07-06 ENCOUNTER — Ambulatory Visit
Admission: RE | Admit: 2023-07-06 | Discharge: 2023-07-06 | Disposition: A | Discharge status: Home / Self Care | Payer: Managed Care, Other (non HMO) | Source: Ambulatory Visit | Attending: Nurse Practitioner | Admitting: Nurse Practitioner

## 2023-07-06 DIAGNOSIS — Z0000 Encounter for general adult medical examination without abnormal findings: Secondary | ICD-10-CM

## 2023-07-06 DIAGNOSIS — Z1231 Encounter for screening mammogram for malignant neoplasm of breast: Secondary | ICD-10-CM | POA: Diagnosis present

## 2023-08-25 ENCOUNTER — Encounter: Payer: Self-pay | Admitting: Nurse Practitioner

## 2023-08-26 ENCOUNTER — Inpatient Hospital Stay: Payer: Managed Care, Other (non HMO)

## 2023-08-26 ENCOUNTER — Inpatient Hospital Stay: Payer: Managed Care, Other (non HMO) | Attending: Internal Medicine | Admitting: Internal Medicine

## 2023-09-07 ENCOUNTER — Encounter: Payer: Self-pay | Admitting: Nurse Practitioner

## 2023-09-07 ENCOUNTER — Inpatient Hospital Stay: Payer: Managed Care, Other (non HMO)

## 2023-09-07 ENCOUNTER — Inpatient Hospital Stay: Payer: Managed Care, Other (non HMO) | Attending: Nurse Practitioner | Admitting: Nurse Practitioner

## 2023-09-07 VITALS — BP 163/87 | HR 74 | Temp 98.7°F | Resp 18 | Wt 239.0 lb

## 2023-09-07 DIAGNOSIS — D508 Other iron deficiency anemias: Secondary | ICD-10-CM

## 2023-09-07 DIAGNOSIS — E611 Iron deficiency: Secondary | ICD-10-CM | POA: Diagnosis not present

## 2023-09-07 DIAGNOSIS — N920 Excessive and frequent menstruation with regular cycle: Secondary | ICD-10-CM | POA: Insufficient documentation

## 2023-09-07 DIAGNOSIS — D649 Anemia, unspecified: Secondary | ICD-10-CM | POA: Insufficient documentation

## 2023-09-07 DIAGNOSIS — Z79899 Other long term (current) drug therapy: Secondary | ICD-10-CM | POA: Diagnosis not present

## 2023-09-07 DIAGNOSIS — Z803 Family history of malignant neoplasm of breast: Secondary | ICD-10-CM | POA: Diagnosis not present

## 2023-09-07 NOTE — Progress Notes (Signed)
 [facility] CONSULT NOTE  Patient Care Team: Berniece Salines, FNP as PCP - General (Nurse Practitioner) Earna Coder, MD as Consulting Physician (Oncology)  CHIEF COMPLAINTS/PURPOSE OF CONSULTATION: ANEMIA  HEMATOLOGY HISTORY  # ANEMIA [Hb; MCV-platelets- WBC; Iron sat; ferritin;  GFR- CT/US- ;  EGD- none/colonoscopy- 2017; and 2021 [Dr.Wohl]   Latest Reference Range & Units 02/20/23 15:46  Iron 27 - 159 ug/dL 32  UIBC 161 - 096 ug/dL 045 (H)  TIBC 409 - 811 ug/dL 914 (H)  Ferritin 15 - 150 ng/mL 10 (L)  Iron Saturation 15 - 55 % 7 (LL)  (LL): Data is critically low (H): Data is abnormally high (L): Data is abnormally low  HISTORY OF PRESENTING ILLNESS: Patient ambulating-independently.   Judy Robinson 53 y.o. female pleasant patient who returns to clinic for follow up of anemia. Denies any neurologic complaints. Denies recent fevers or illnesses. Denies any easy bleeding or bruising. No melena or hematochezia. No pica or restless leg. Reports good appetite and denies weight loss. Denies chest pain. Denies any nausea, vomiting, constipation, or diarrhea. Denies urinary complaints. Patient offers no further specific complaints today.   Review of Systems  Constitutional:  Positive for malaise/fatigue. Negative for chills, diaphoresis, fever and weight loss.  HENT:  Negative for nosebleeds and sore throat.   Eyes:  Negative for double vision.  Respiratory:  Negative for cough, hemoptysis, sputum production, shortness of breath and wheezing.   Cardiovascular:  Negative for chest pain, palpitations, orthopnea and leg swelling.  Gastrointestinal:  Negative for abdominal pain, blood in stool, constipation, diarrhea, heartburn, melena, nausea and vomiting.  Genitourinary:  Negative for dysuria, frequency and urgency.  Musculoskeletal:  Negative for back pain and joint pain.  Skin: Negative.  Negative for itching and rash.  Neurological:  Negative for  dizziness, tingling, focal weakness, weakness and headaches.  Endo/Heme/Allergies:  Does not bruise/bleed easily.  Psychiatric/Behavioral:  Negative for depression. The patient is not nervous/anxious and does not have insomnia.    MEDICAL HISTORY:  Past Medical History:  Diagnosis Date   Essential hypertension, benign 02/03/2018   Obesity, Class III, BMI 40-49.9 (morbid obesity) (HCC) 02/09/2015   Torn meniscus     SURGICAL HISTORY: Past Surgical History:  Procedure Laterality Date   CESAREAN SECTION     COLONOSCOPY WITH PROPOFOL N/A 03/25/2016   Procedure: COLONOSCOPY WITH PROPOFOL;  Surgeon: Midge Minium, MD;  Location: [facility] ENDOSCOPY;  Service: Endoscopy;  Laterality: N/A;   COLONOSCOPY WITH PROPOFOL N/A 05/03/2020   Procedure: COLONOSCOPY WITH PROPOFOL;  Surgeon: Midge Minium, MD;  Location[facility] ENDOSCOPY;  Service: Endoscopy;  Laterality: N/A;   TUBAL LIGATION      SOCIAL HISTORY: Social History   Socioeconomic History   Marital status: Married    Spouse name: Not on file   Number of children: Not on file   Years of education: Not on file   Highest education level: Not on file  Occupational History   Not on file  Tobacco Use   Smoking status: Former    Current packs/day: 0.00    Average packs/day: 0.3 packs/day for 1 year (0.3 ttl pk-yrs)    Types: Cigarettes    Start date: 01/29/2010    Quit date: 01/30/2011    Years since quitting: 12.6   Smokeless tobacco: Never  Vaping Use   Vaping status: Never Used  Substance and Sexual Activity   Alcohol use: Yes    Alcohol/week: 4.0 - 7.0 standard drinks of alcohol  Types: 4 - 7 Standard drinks or equivalent per week    Comment: ocassional   Drug use: No   Sexual activity: Yes    Partners: Male    Birth control/protection: Surgical    Comment: BTL  Other Topics Concern   Not on file  Social History Narrative   Not on file   Social Drivers of Health   Financial Resource Strain: Low Risk  (04/03/2023)   Overall  Financial Resource Strain (CARDIA)    Difficulty of Paying Living Expenses: Not hard at all  Food Insecurity: No Food Insecurity (04/03/2023)   Hunger Vital Sign    Worried About Running Out of Food in the Last Year: Never true    Ran Out of Food in the Last Year: Never true  Transportation Needs: No Transportation Needs (04/03/2023)   PRAPARE - Administrator, Civil Service (Medical): No    Lack of Transportation (Non-Medical): No  Physical Activity: Inactive (04/03/2023)   Exercise Vital Sign    Days of Exercise per Week: 0 days    Minutes of Exercise per Session: 0 min  Stress: No Stress Concern Present (04/03/2023)   [employer] of Occupational Health - Occupational Stress Questionnaire    Feeling of Stress : Only a little  Social Connections: Moderately Integrated (04/03/2023)   Social Connection and Isolation Panel [NHANES]    Frequency of Communication with Friends and Family: More than three times a week    Frequency of Social Gatherings with Friends and Family: More than three times a week    Attends Religious Services: More than 4 times per year    Active Member of [employer] or Organizations: No    Attends Banker Meetings: Never    Marital Status: Married  Catering manager Violence: Not At Risk (04/03/2023)   Humiliation, Afraid, Rape, and Kick questionnaire    Fear of Current or Ex-Partner: No    Emotionally Abused: No    Physically Abused: No    Sexually Abused: No    FAMILY HISTORY: Family History  Problem Relation Age of Onset   Hypertension Mother    Thyroid disease Mother    Diabetes Brother    Heart attack Brother    Heart disease Father 12       heart attack   Breast cancer Paternal Aunt    Asthma Maternal Grandmother    Cancer Maternal Grandfather        prostate?   Heart attack Paternal Grandmother     ALLERGIES:  has no known allergies.  MEDICATIONS:  Current Outpatient Medications  Medication Sig Dispense Refill    DULoxetine (CYMBALTA) 30 MG capsule Take 1 capsule (30 mg total) by mouth daily. 90 capsule 1   Iron, Ferrous Sulfate, 325 (65 Fe) MG TABS Take 325 mg by mouth daily. 90 tablet 5   medroxyPROGESTERone (PROVERA) 5 MG tablet Take 1 tablet (5 mg total) by mouth daily. Take for 2 weeks, then stop for 2 weeks then repeat. First day February 19 14 tablet 12   No current facility-administered medications for this visit.    PHYSICAL EXAMINATION: Vitals:   09/07/23 1313  BP: (!) 163/87  Pulse: 74  Resp: 18  Temp: 98.7 F (37.1 C)  SpO2: 100%   Filed Weights   09/07/23 1313  Weight: 239 lb (108.4 kg)    Physical Exam Vitals reviewed.  Constitutional:      Appearance: She is not ill-appearing.  HENT:     Head:  Normocephalic and atraumatic.  Cardiovascular:     Rate and Rhythm: Normal rate and regular rhythm.  Pulmonary:     Effort: No respiratory distress.  Abdominal:     General: There is no distension.     Palpations: Abdomen is soft.  Skin:    General: Skin is warm.     Coloration: Skin is not pale.  Neurological:     Mental Status: She is alert and oriented to person, place, and time.  Psychiatric:        Mood and Affect: Mood normal.        Behavior: Behavior normal.    LABORATORY DATA:  I have reviewed the data as listed      No results found.  ASSESSMENT & PLAN:   # Anemia- Hb- AUG 2024-iron sat-7%; Ferritin -6;Hb-10- Mildly symptomatic. Secondary to heavy menses. She has started oral iron. Tolerating well. Ferritin has improved to 41, iron sat 48%. Hmg 12.9. Hold IV iron. Continue oral iron.    # Etiology of iron deficiency- likely menorrhagia. UA was positive for blood but likely contaminant d/t menses. LDH mildly elevated. Retic normal. B12 normal. Folate low normal. Last colonoscopy 04/2020- Dr. Servando Snare. She has not had egd or capsule. Reviewed that if counts drop, would recommend GI workup. She is followed by Dr. Philippa Sicks. On provera with improved regularity of  menstrual periods.   Disposition:  3 months- labs at [facility]- cbc, ferritin, iron studies 4 mo- see me, +/- venofer- la    Thank you Dr. Zane Herald for allowing me to participate in the care of your pleasant patient. Please do not hesitate to contact me with questions or concerns in the interim.  No problem-specific Assessment & Plan notes found for this encounter.  All questions were answered. The patient knows to call the clinic with any problems, questions or concerns.  Alinda Dooms, NP 09/07/2023

## 2023-10-07 ENCOUNTER — Encounter: Payer: Self-pay | Admitting: Internal Medicine

## 2024-01-05 ENCOUNTER — Telehealth: Admitting: Family Medicine

## 2024-01-05 DIAGNOSIS — M546 Pain in thoracic spine: Secondary | ICD-10-CM | POA: Diagnosis not present

## 2024-01-05 MED ORDER — NAPROXEN 500 MG PO TABS: 500.0000 mg | ORAL_TABLET | Freq: Two times a day (BID) | ORAL | 0 refills | Status: DC

## 2024-01-05 MED ORDER — CYCLOBENZAPRINE HCL 10 MG PO TABS: 10.0000 mg | ORAL_TABLET | Freq: Three times a day (TID) | ORAL | 0 refills | Status: DC | PRN

## 2024-01-05 NOTE — Patient Instructions (Signed)
 Judy Robinson, thank you for joining Judy CHRISTELLA Barefoot, NP for today's virtual visit.  While this provider is not your primary care provider (PCP), if your PCP is located in our provider database this encounter information will be shared with them immediately following your visit.   A [facility] MyChart account gives you access to today's visit and all your visits, tests, and labs performed at [facility]  click here if you don't have a [facility] MyChart account or go to [email or web address]  Consent: (Patient) Judy Robinson provided verbal consent for this virtual visit at the beginning of the encounter.  Current Medications:  Current Outpatient Medications:    cyclobenzaprine (FLEXERIL) 10 MG tablet, Take 1 tablet (10 mg total) by mouth 3 (three) times daily as needed for muscle spasms., Disp: 30 tablet, Rfl: 0   naproxen (NAPROSYN) 500 MG tablet, Take 1 tablet (500 mg total) by mouth 2 (two) times daily with a meal., Disp: 30 tablet, Rfl: 0   DULoxetine  (CYMBALTA ) 30 MG capsule, Take 1 capsule (30 mg total) by mouth daily., Disp: 90 capsule, Rfl: 1   Iron , Ferrous Sulfate , 325 (65 Fe) MG TABS, Take 325 mg by mouth daily., Disp: 90 tablet, Rfl: 5   medroxyPROGESTERone  (PROVERA ) 5 MG tablet, Take 1 tablet (5 mg total) by mouth daily. Take for 2 weeks, then stop for 2 weeks then repeat. First day February 19, Disp: 14 tablet, Rfl: 12   Medications ordered in this encounter:  Meds ordered this encounter  Medications   cyclobenzaprine (FLEXERIL) 10 MG tablet    Sig: Take 1 tablet (10 mg total) by mouth 3 (three) times daily as needed for muscle spasms.    Dispense:  30 tablet    Refill:  0    Supervising Provider:   LAMPTEY, PHILIP O [[id number]]   naproxen (NAPROSYN) 500 MG tablet    Sig: Take 1 tablet (500 mg total) by mouth 2 (two) times daily with a meal.    Dispense:  30 tablet    Refill:  0    Supervising Provider:   BLAISE ALEENE KIDD [[id number]]      *If you need refills on other medications prior to your next appointment, please contact your pharmacy*  Follow-Up: Call back or seek an in-person evaluation if the symptoms worsen or if the condition fails to improve as anticipated.  [facility] [phone]  Other Instructions  Home Care Stay active.  Start with short walks on flat ground if you can.  Try to walk farther each day. Do not sit, drive or stand in one place for more than 30 minutes.  Do not stay in bed. Do not avoid exercise or work.  Activity can help your back heal faster. Be careful when you bend or lift an object.  Bend at your knees, keep the object close to you, and do not twist. Sleep on a firm mattress.  Lie on your side, and bend your knees.  If you lie on your back, put a pillow under your knees. Only take medicines as told by your doctor. Put ice on the injured area. Put ice in a plastic bag Place a towel between your skin and the bag Leave the ice on for 15-20 minutes, 3-4 times a day for the first 2-3 days. 210 After that, you can switch between ice and heat packs. Ask your doctor about back exercises or massage. Avoid feeling anxious or stressed.  Find good ways  to deal with stress, such as exercise.  Get Help Right Way If: Your pain does not go away with rest or medicine. Your pain does not go away in 1 week. You have new problems. You do not feel well. The pain spreads into your legs. You cannot control when you poop (bowel movement) or pee (urinate) You feel sick to your stomach (nauseous) or throw up (vomit) You have belly (abdominal) pain. You feel like you may pass out (faint). If you develop a fever.  Make Sure you: Understand these instructions. Will watch your condition Will get help right away if you are not doing well or get worse.       If you have been instructed to have an in-person evaluation today at a local Urgent Care facility, please use the link below. It  will take you to a list of all of our available [facility], including address, phone number and hours of operation. Please do not delay care.  [facility]  If you or a family member do not have a primary care provider, use the link below to schedule a visit and establish care. When you choose a [facility] primary care physician or advanced practice provider, you gain a long-term partner in health. Find a Primary Care Provider  Learn more about [facility] in-office and virtual care options:  - Get Care Now

## 2024-01-05 NOTE — Progress Notes (Signed)
 Virtual Visit Consent   Judy Robinson, you are scheduled for a virtual visit with a [facility] provider today. Just as with appointments in the office, your consent must be obtained to participate. Your consent will be active for this visit and any virtual visit you may have with one of our providers in the next 365 days. If you have a MyChart account, a copy of this consent can be sent to you electronically.  As this is a virtual visit, video technology does not allow for your provider to perform a traditional examination. This may limit your provider's ability to fully assess your condition. If your provider identifies any concerns that need to be evaluated in person or the need to arrange testing (such as labs, EKG, etc.), we will make arrangements to do so. Although advances in technology are sophisticated, we cannot ensure that it will always work on either your end or our end. If the connection with a video visit is poor, the visit may have to be switched to a telephone visit. With either a video or telephone visit, we are not always able to ensure that we have a secure connection.  By engaging in this virtual visit, you consent to the provision of healthcare and authorize for your insurance to be billed (if applicable) for the services provided during this visit. Depending on your insurance coverage, you may receive a charge related to this service.  I need to obtain your verbal consent now. Are you willing to proceed with your visit today? Judy Robinson has provided verbal consent on 01/05/2024 for a virtual visit (video or telephone). Chiquita CHRISTELLA Barefoot, NP  Date: 01/05/2024 10:05 AM   Virtual Visit via Video Note   I, Chiquita CHRISTELLA Barefoot, connected with  Judy Robinson  ([id number], [birth year]) on 01/05/24 at 10:00 AM EDT by a video-enabled telemedicine application and verified that I am speaking with the correct person using two identifiers.  Location: Patient: Virtual Visit  Location Patient: Home Provider: Virtual Visit Location Provider: Home Office   I discussed the limitations of evaluation and management by telemedicine and the availability of in person appointments. The patient expressed understanding and agreed to proceed.    History of Present Illness: Judy Robinson is a 53 y.o. who identifies as a female who was assigned female at birth, and is being seen today for upper back pain  Left side upper back near the bra band/strap. Reports feeling pain off and on for several weeks and then became constant over the weekend.  Range of motion is present- just has pain with movement. Tried some ibuprofen 2-4 tablets at a time. Helps some, but not fully. Pain is 8-10/10 now.  Reports a history of muscle strain years ago. Denies injury or trauma to the back. No back surgeries. Denies sensation changes. Denies weakness.  Problems:  Patient Active Problem List   Diagnosis Date Noted   Symptomatic anemia 02/27/2023   Iron  deficiency anemia 02/20/2023   Menorrhagia with irregular cycle 08/15/2022   Dysthymia 08/15/2022   Hyperlipidemia 04/10/2021   History of colonic polyps    Benign neoplasm of sigmoid colon    [place] Quervain's disease (radial styloid tenosynovitis) 02/09/2015   Acne inversa 02/09/2015   Obesity, Class III, BMI 40-49.9 (morbid obesity) 02/09/2015    Allergies: No Known Allergies Medications:  Current Outpatient Medications:    DULoxetine  (CYMBALTA ) 30 MG capsule, Take 1 capsule (30 mg total) by mouth daily., Disp: 90  capsule, Rfl: 1   Iron , Ferrous Sulfate , 325 (65 Fe) MG TABS, Take 325 mg by mouth daily., Disp: 90 tablet, Rfl: 5   medroxyPROGESTERone  (PROVERA ) 5 MG tablet, Take 1 tablet (5 mg total) by mouth daily. Take for 2 weeks, then stop for 2 weeks then repeat. First day February 19, Disp: 14 tablet, Rfl: 12  Observations/Objective: Patient is well-developed, well-nourished in no acute distress.  Resting comfortably  at home.   Head is normocephalic, atraumatic.  No labored breathing.  Speech is clear and coherent with logical content.  Patient is alert and oriented at baseline.    Assessment and Plan:    1. Acute left-sided thoracic back pain (Primary)  - cyclobenzaprine (FLEXERIL) 10 MG tablet; Take 1 tablet (10 mg total) by mouth 3 (three) times daily as needed for muscle spasms.  Dispense: 30 tablet; Refill: 0 - naproxen (NAPROSYN) 500 MG tablet; Take 1 tablet (500 mg total) by mouth 2 (two) times daily with a meal.  Dispense: 30 tablet; Refill: 0  -no red flags -advised to follow up in person if worsening -info for back pain on AVS  Reviewed side effects, risks and benefits of medication.    Patient acknowledged agreement and understanding of the plan.   Past Medical, Surgical, Social History, Allergies, and Medications have been Reviewed.   Follow Up Instructions: I discussed the assessment and treatment plan with the patient. The patient was provided an opportunity to ask questions and all were answered. The patient agreed with the plan and demonstrated an understanding of the instructions.  A copy of instructions were sent to the patient via MyChart unless otherwise noted below.    The patient was advised to call back or seek an in-person evaluation if the symptoms worsen or if the condition fails to improve as anticipated.    Chiquita CHRISTELLA Barefoot, NP

## 2024-01-07 ENCOUNTER — Ambulatory Visit: Admitting: Nurse Practitioner

## 2024-01-07 ENCOUNTER — Ambulatory Visit

## 2024-01-12 ENCOUNTER — Encounter: Payer: Self-pay | Admitting: Nurse Practitioner

## 2024-01-14 ENCOUNTER — Inpatient Hospital Stay: Attending: Nurse Practitioner | Admitting: Nurse Practitioner

## 2024-01-14 ENCOUNTER — Ambulatory Visit: Admitting: Nurse Practitioner

## 2024-01-14 ENCOUNTER — Ambulatory Visit

## 2024-01-14 ENCOUNTER — Other Ambulatory Visit: Payer: Self-pay | Admitting: *Deleted

## 2024-01-14 ENCOUNTER — Encounter: Payer: Self-pay | Admitting: Nurse Practitioner

## 2024-01-14 VITALS — BP 148/90 | HR 88 | Temp 98.8°F | Resp 18 | Wt 246.0 lb

## 2024-01-14 DIAGNOSIS — N920 Excessive and frequent menstruation with regular cycle: Secondary | ICD-10-CM | POA: Diagnosis present

## 2024-01-14 DIAGNOSIS — D508 Other iron deficiency anemias: Secondary | ICD-10-CM

## 2024-01-14 DIAGNOSIS — D500 Iron deficiency anemia secondary to blood loss (chronic): Secondary | ICD-10-CM | POA: Insufficient documentation

## 2024-01-14 DIAGNOSIS — Z87891 Personal history of nicotine dependence: Secondary | ICD-10-CM | POA: Diagnosis not present

## 2024-01-14 NOTE — Progress Notes (Signed)
 Patient has been doing ok, she lost her mother in May, its been tough, but she is doing good, no new questions for today.

## 2024-01-15 ENCOUNTER — Encounter: Payer: Self-pay | Admitting: Internal Medicine

## 2024-01-15 NOTE — Progress Notes (Signed)
 [facility] CONSULT NOTE  Patient Care Team: Gareth Mliss FALCON, FNP as PCP - General (Nurse Practitioner) Rennie Cindy SAUNDERS, MD as Consulting Physician (Oncology)  CHIEF COMPLAINTS/PURPOSE OF CONSULTATION: ANEMIA  HEMATOLOGY HISTORY  # ANEMIA [Hb; MCV-platelets- WBC; Iron  sat; ferritin;  GFR- CT/US - ;  EGD- none/colonoscopy- 2017; and 2021 [Dr.Wohl]   Latest Reference Range & Units 02/20/23 15:46  Iron  27 - 159 ug/dL 32  UIBC 868 - 574 ug/dL 559 (H)  TIBC 749 - 549 ug/dL 527 (H)  Ferritin 15 - 150 ng/mL 10 (L)  Iron  Saturation 15 - 55 % 7 (LL)  (LL): Data is critically  low (H): Data is abnormally high (L): Data is abnormally low  HISTORY OF PRESENTING ILLNESS: Patient ambulating-independently.   Judy Robinson 53 y.o. female pleasant patient who returns to clinic for follow up of anemia. Last menses in January. Denies any neurologic complaints. Denies recent fevers or illnesses. Denies any easy bleeding or bruising. No melena or hematochezia. No pica or restless leg. Reports good appetite and denies weight loss. Denies chest pain. Denies any nausea, vomiting, constipation, or diarrhea. Denies urinary complaints. Patient offers no further specific complaints today.   Review of Systems  Constitutional:  Positive for malaise/fatigue. Negative for chills, diaphoresis, fever and weight loss.  HENT:  Negative for nosebleeds and sore throat.   Eyes:  Negative for double vision.  Respiratory:  Negative for cough, hemoptysis, sputum production, shortness of breath and wheezing.   Cardiovascular:  Negative for chest pain, palpitations, orthopnea and leg swelling.  Gastrointestinal:  Negative for abdominal pain, blood in stool, constipation, diarrhea, heartburn, melena, nausea and vomiting.  Genitourinary:  Negative for dysuria, frequency and urgency.  Musculoskeletal:  Negative for back pain and joint pain.  Skin: Negative.  Negative for itching and rash.  Neurological:  Negative for dizziness, tingling, focal weakness, weakness and headaches.  Endo/Heme/Allergies:  Does not bruise/bleed easily.  Psychiatric/Behavioral:  Negative for depression. The patient is not nervous/anxious and does not have insomnia.    MEDICAL HISTORY:  Past Medical History:  Diagnosis Date   Essential hypertension, benign 02/03/2018   Obesity, Class III, BMI 40-49.9 (morbid obesity) 02/09/2015   Torn meniscus     SURGICAL HISTORY: Past Surgical History:  Procedure Laterality Date   CESAREAN SECTION     COLONOSCOPY WITH PROPOFOL  N/A 03/25/2016   Procedure: COLONOSCOPY WITH  PROPOFOL ;  Surgeon: Rogelia Copping, MD;  Location: [facility] ENDOSCOPY;  Service: Endoscopy;  Laterality: N/A;   COLONOSCOPY WITH PROPOFOL  N/A 05/03/2020   Procedure: COLONOSCOPY WITH PROPOFOL ;  Surgeon: Copping Rogelia, MD;  Location: [facility] ENDOSCOPY;  Service: Endoscopy;  Laterality: N/A;   TUBAL LIGATION      SOCIAL HISTORY: Social History   Socioeconomic History   Marital status: Married    Spouse name: Not on file   Number of children: Not on file   Years of education: Not on file   Highest education level: Not on file  Occupational History   Not on file  Tobacco Use   Smoking status: Former    Current packs/day: 0.00    Average packs/day: 0.3 packs/day for 1 year (0.3 ttl pk-yrs)    Types: Cigarettes    Start date: 01/29/2010    Quit date: 01/30/2011    Years since quitting: 12.9   Smokeless tobacco: Never  Vaping Use   Vaping status: Never Used  Substance and Sexual Activity   Alcohol use: Yes    Alcohol/week: 4.0 - 7.0  standard drinks of alcohol    Types: 4 - 7 Standard drinks or equivalent per week    Comment: ocassional   Drug use: No   Sexual activity: Yes    Partners: Male    Birth control/protection: Surgical    Comment: BTL  Other Topics Concern   Not on file  Social History Narrative   Not on file   Social Drivers of Health   Financial Resource Strain: Low Risk  (04/03/2023)   Overall Financial Resource Strain (CARDIA)    Difficulty of Paying Living Expenses: Not hard at all  Food Insecurity: No Food Insecurity (04/03/2023)   Hunger Vital Sign    Worried About Running Out of Food in the Last Year: Never true    Ran Out of Food in the Last Year: Never true  Transportation Needs: No Transportation Needs (04/03/2023)   PRAPARE - Administrator, Civil Service (Medical): No    Lack of Transportation (Non-Medical): No  Physical Activity: Inactive (04/03/2023)   Exercise Vital Sign    Days of Exercise per Week: 0 days    Minutes of Exercise per Session: 0  min  Stress: No Stress Concern Present (04/03/2023)   [employer] of Occupational Health - Occupational Stress Questionnaire    Feeling of Stress : Only a little  Social Connections: Moderately Integrated (04/03/2023)   Social Connection and Isolation Panel    Frequency of Communication with Friends and Family: More than three times a week    Frequency of Social Gatherings with Friends and Family: More than three times a week    Attends Religious Services: More than 4 times per year    Active Member of [employer] or Organizations: No    Attends Banker Meetings: Never    Marital Status: Married  Catering manager Violence: Not At Risk (04/03/2023)   Humiliation, Afraid, Rape, and Kick questionnaire    Fear of Current or Ex-Partner: No    Emotionally Abused: No    Physically Abused: No    Sexually Abused: No    FAMILY HISTORY: Family History  Problem Relation Age of Onset   Hypertension Mother    Thyroid disease Mother    Diabetes Brother    Heart attack Brother    Heart disease Father 70       heart attack   Breast cancer Paternal Aunt    Asthma Maternal Grandmother    Cancer Maternal Grandfather        prostate?   Heart attack Paternal Grandmother     ALLERGIES:  has no known allergies.  MEDICATIONS:  Current Outpatient Medications  Medication Sig Dispense Refill   cyclobenzaprine  (FLEXERIL ) 10 MG tablet Take 1 tablet (10 mg total) by mouth 3 (three) times daily as needed for muscle spasms. 30 tablet 0   DULoxetine  (CYMBALTA ) 30 MG capsule Take 1 capsule (30 mg total) by mouth daily. 90 capsule 1   Iron , Ferrous Sulfate , 325 (65 Fe) MG TABS Take 325 mg by mouth daily. 90 tablet 5   medroxyPROGESTERone  (PROVERA ) 5 MG tablet Take 1 tablet (5 mg total) by mouth daily. Take for 2 weeks, then stop for 2 weeks then repeat. First day February 19 14 tablet 12   naproxen  (NAPROSYN ) 500 MG tablet Take 1 tablet (500 mg total) by mouth 2 (two) times daily with a meal. 30  tablet 0   No current facility-administered medications for this visit.    PHYSICAL EXAMINATION: Vitals:   01/14/24 1355 01/14/24  1356  BP: (!) 153/88 (!) 148/90  Pulse: 88   Resp: 18   Temp: 98.8 F (37.1 C)   SpO2: 99%    Filed Weights   01/14/24 1355  Weight: 246 lb (111.6 kg)    Physical Exam Vitals reviewed.  Constitutional:      Appearance: She is not ill-appearing.  HENT:     Head: Normocephalic and atraumatic.  Cardiovascular:     Rate and Rhythm: Normal rate and regular rhythm.  Pulmonary:     Effort: No respiratory distress.  Abdominal:     General: There is no distension.     Palpations: Abdomen is soft.  Skin:    General: Skin is warm.     Coloration: Skin is not pale.  Neurological:     Mental Status: She is alert and oriented to person, place, and time.  Psychiatric:        Mood and Affect: Mood normal.        Behavior: Behavior normal.     LABORATORY DATA:  I have reviewed the data as listed     No results found.  ASSESSMENT & PLAN:   # Anemia- Hb- AUG 2024-iron  sat-7%; Ferritin -6;Hb-10- Mildly symptomatic. Secondary to heavy menses. She has started oral iron . Tolerating well. Hmg has normalized. Ferritin stable and well replenished. COntinue oral iron .     # Etiology of iron  deficiency- likely menorrhagia- last period January. Hormonal levels consistent with menopause. UA previously positive for blood but likely contaminant. LDH mildly elevated. Retic normal. B12 normal. Folate low normal. Last colonoscopy 04/2020- Dr. Jinny. She has not had egd or capsule. Reviewed that if counts drop, would recommend GI workup.   Disposition:  6 months- labs at [facility]- cbc, ferritin, iron  studies See me or Dr Rennie a week or two later- la    Thank you Dr. Gareth for allowing me to participate in the care of your pleasant patient. Please do not hesitate to contact me with questions or concerns in the interim.  No problem-specific Assessment & Plan  notes found for this encounter.  All questions were answered. The patient knows to call the clinic with any problems, questions or concerns.  Judy KANDICE Dawn, NP 01/15/2024

## 2024-05-13 ENCOUNTER — Ambulatory Visit: Admitting: Nurse Practitioner

## 2024-05-24 ENCOUNTER — Encounter: Payer: Self-pay | Admitting: Nurse Practitioner

## 2024-05-24 ENCOUNTER — Ambulatory Visit: Admitting: Nurse Practitioner

## 2024-05-24 VITALS — BP 140/90 | HR 63 | Temp 98.6°F | Ht 63.0 in | Wt 252.0 lb

## 2024-05-24 DIAGNOSIS — Z1231 Encounter for screening mammogram for malignant neoplasm of breast: Secondary | ICD-10-CM

## 2024-05-24 DIAGNOSIS — R030 Elevated blood-pressure reading, without diagnosis of hypertension: Secondary | ICD-10-CM

## 2024-05-24 DIAGNOSIS — Z23 Encounter for immunization: Secondary | ICD-10-CM | POA: Diagnosis not present

## 2024-05-24 DIAGNOSIS — M25562 Pain in left knee: Secondary | ICD-10-CM | POA: Diagnosis not present

## 2024-05-24 DIAGNOSIS — G8929 Other chronic pain: Secondary | ICD-10-CM

## 2024-05-24 MED ORDER — CELECOXIB 100 MG PO CAPS: 100.0000 mg | ORAL_CAPSULE | Freq: Two times a day (BID) | ORAL | 0 refills | Status: DC

## 2024-05-24 NOTE — Progress Notes (Signed)
 BP (!) 140/90   Pulse 63   Temp 98.6 F (37 C)   Ht 5' 3 (1.6 m)   Wt 252 lb (114.3 kg)   SpO2 97%   BMI 44.64 kg/m    Subjective:    Patient ID: Judy Robinson, female    DOB: 05/03/1971, 53 y.o.   MRN: [id number]  HPI: Judy Robinson is a 53 y.o. female  Chief Complaint  Patient presents with   Knee Pain    Pt c/o chronic left knee pain.    Discussed the use of AI scribe software for clinical note transcription with the patient, who gave verbal consent to proceed.  History of Present Illness Judy Robinson is a 53 year old female who presents with chronic left knee pain.  Left knee pain - Chronic sharp pain localized to a specific spot on the left knee - Pain worsens with walking, especially when ascending stairs - Pain is less severe when descending stairs - No recent trauma or injury to the knee - Partial relief with Tylenol, but pain is not completely alleviated  Elevated blood pressure - Blood pressure runs high at times - Attributes elevated blood pressure to pain - Monitors blood pressure at home with a personal device -discussed with patient that if blood pressure still running high at home we should start blood pressure medication.   Medication use - Currently uses Tylenol for pain management - Not currently taking Suboxone, which was previously on her medication list         05/24/2024    9:24 AM 04/03/2023    8:37 AM 02/27/2023    1:26 PM  Depression screen PHQ 2/9  Decreased Interest 1 0 0  Down, Depressed, Hopeless 1 0 0  PHQ - 2 Score 2 0 0  Altered sleeping 2 0   Tired, decreased energy 1 0   Change in appetite 1 0   Feeling bad or failure about yourself  0 0   Trouble concentrating 1 0   Moving slowly or fidgety/restless 0 0   Suicidal thoughts 0 0   PHQ-9 Score 7 0    Difficult doing work/chores Somewhat difficult Not difficult at all      Data saved with a previous flowsheet row definition    Relevant past  medical, surgical, family and social history reviewed and updated as indicated. Interim medical history since our last visit reviewed. Allergies and medications reviewed and updated.  Review of Systems Ten systems reviewed and is negative except as mentioned in HPI      Objective:      BP (!) 140/90   Pulse 63   Temp 98.6 F (37 C)   Ht 5' 3 (1.6 m)   Wt 252 lb (114.3 kg)   SpO2 97%   BMI 44.64 kg/m    Wt Readings from Last 3 Encounters:  05/24/24 252 lb (114.3 kg)  01/14/24 246 lb (111.6 kg)  09/07/23 239 lb (108.4 kg)    Physical Exam GENERAL: Alert, cooperative, well developed, no acute distress HEENT: Normocephalic, normal oropharynx, moist mucous membranes CHEST: Clear to auscultation bilaterally, No wheezes, rhonchi, or crackles CARDIOVASCULAR: Normal heart rate and rhythm, S1 and S2 normal without murmurs ABDOMEN: Soft, non-tender, non-distended, without organomegaly, Normal bowel sounds EXTREMITIES: No cyanosis or edema, tenderness to left patella  NEUROLOGICAL: Cranial nerves grossly intact, Moves all extremities without gross motor or sensory deficit  Results for orders placed or performed in visit  on 02/20/23  Iron , TIBC and Ferritin Panel   Collection Time: 02/20/23  3:46 PM  Result Value Ref Range   Total Iron  Binding Capacity 472 (H) 250 - 450 ug/dL   UIBC 559 (H) 868 - 574 ug/dL   Iron  32 27 - 159 ug/dL   Iron  Saturation 7 (LL) 15 - 55 %   Ferritin 10 (L) 15 - 150 ng/mL  CBC with Differential/Platelet   Collection Time: 02/20/23  3:46 PM  Result Value Ref Range   WBC 9.3 3.4 - 10.8 x10E3/uL   RBC 4.43 3.77 - 5.28 x10E6/uL   Hemoglobin 10.7 (L) 11.1 - 15.9 g/dL   Hematocrit 64.9 65.9 - 46.6 %   MCV 79 79 - 97 fL   MCH 24.2 (L) 26.6 - 33.0 pg   MCHC 30.6 (L) 31.5 - 35.7 g/dL   RDW 84.6 88.2 - 84.5 %   Platelets 356 150 - 450 x10E3/uL   Neutrophils 54 Not Estab. %   Lymphs 33 Not Estab. %   Monocytes 7 Not Estab. %   Eos 5 Not Estab. %   Basos 1  Not Estab. %   Neutrophils Absolute 5.0 1.4 - 7.0 x10E3/uL   Lymphocytes Absolute 3.1 0.7 - 3.1 x10E3/uL   Monocytes Absolute 0.7 0.1 - 0.9 x10E3/uL   EOS (ABSOLUTE) 0.5 (H) 0.0 - 0.4 x10E3/uL   Basophils Absolute 0.1 0.0 - 0.2 x10E3/uL   Immature Granulocytes 0 Not Estab. %   Immature Grans (Abs) 0.0 0.0 - 0.1 x10E3/uL  Comprehensive metabolic panel   Collection Time: 02/20/23  3:46 PM  Result Value Ref Range   Glucose 97 70 - 99 mg/dL   BUN 11 6 - 24 mg/dL   Creatinine, Ser 9.11 0.57 - 1.00 mg/dL   eGFR 79 >40 fO/fpw/8.26   BUN/Creatinine Ratio 13 9 - 23   Sodium 141 134 - 144 mmol/L   Potassium 4.5 3.5 - 5.2 mmol/L   Chloride 105 96 - 106 mmol/L   CO2 22 20 - 29 mmol/L   Calcium 9.5 8.7 - 10.2 mg/dL   Total Protein 7.4 6.0 - 8.5 g/dL   Albumin 4.3 3.8 - 4.9 g/dL   Globulin, Total 3.1 1.5 - 4.5 g/dL   Bilirubin Total 0.3 0.0 - 1.2 mg/dL   Alkaline Phosphatase 94 44 - 121 IU/L   AST 16 0 - 40 IU/L   ALT 16 0 - 32 IU/L  Lipid panel   Collection Time: 02/20/23  3:46 PM  Result Value Ref Range   Cholesterol, Total 196 100 - 199 mg/dL   Triglycerides 876 0 - 149 mg/dL   HDL 38 (L) >60 mg/dL   VLDL Cholesterol Cal 22 5 - 40 mg/dL   LDL Chol Calc (NIH) 863 (H) 0 - 99 mg/dL   Chol/HDL Ratio 5.2 (H) 0.0 - 4.4 ratio  Hemoglobin A1c   Collection Time: 02/20/23  3:46 PM  Result Value Ref Range   Hgb A1c MFr Bld 5.9 (H) 4.8 - 5.6 %   Est. average glucose Bld gHb Est-mCnc 123 mg/dL          Assessment & Plan:   Problem List Items Addressed This Visit   None Visit Diagnoses       Chronic pain of left knee    -  Primary   Relevant Medications   celecoxib (CELEBREX) 100 MG capsule   Other Relevant Orders   AMB referral to orthopedics     Immunization due  Relevant Orders   Flu vaccine trivalent PF, 6mos and older(Flulaval,Afluria,Fluarix,Fluzone) (Completed)     Screening mammogram for breast cancer       Relevant Orders   MM 3D SCREENING MAMMOGRAM BILATERAL  BREAST     Elevated blood pressure reading            Assessment and Plan Assessment & Plan Chronic left knee pain Sharp pain exacerbated by walking and ascending stairs. No recent trauma. Likely due to arthritis with inflammation causing a flare-up. Current management with Tylenol provides partial relief. - Ordered x-ray of left knee to facilitate referral to orthopedics. - Prescribed Celebrex twice daily for 10 days to reduce inflammation. - Recommended Voltaren gel or generic equivalent for topical application to the knee. - Referred to orthopedics for further evaluation and management. - Advised to avoid overcompensating with the other leg to prevent additional pain.  Encounter for immunization Due for flu vaccination. - Administered flu shot.  Encounter for screening mammogram for malignant neoplasm of breast Overdue for mammogram screening. - Ordered mammogram.  Elevated blood pressure -monitor blood pressure at home, if still elevated may need to consider blood pressure medication -can work on Judy Robinson diet      Follow up plan: Return if symptoms worsen or fail to improve.

## 2024-06-10 ENCOUNTER — Other Ambulatory Visit: Payer: Self-pay | Admitting: Nurse Practitioner

## 2024-06-10 DIAGNOSIS — G8929 Other chronic pain: Secondary | ICD-10-CM

## 2024-06-13 NOTE — Telephone Encounter (Signed)
 Requested medications are due for refill today.  yes  Requested medications are on the active medications list.  yes  Last refill. 05/24/2024 #20 0 rf  Future visit scheduled.   no  Notes to clinic.  Please review for refill. Pt was given a small quantity.    Requested Prescriptions  Pending Prescriptions Disp Refills   celecoxib  (CELEBREX ) 100 MG capsule [Pharmacy Med Name: Celecoxib  100 MG Oral Capsule] 20 capsule 0    Sig: Take 1 capsule by mouth twice daily     Analgesics:  COX2 Inhibitors Failed - 06/13/2024  5:55 PM      Failed - Manual Review: Labs are only required if the patient has taken medication for more than 8 weeks.      Failed - HGB in normal range and within 360 days    Hemoglobin  Date Value Ref Range Status  02/20/2023 10.7 (L) 11.1 - 15.9 g/dL Final         Failed - Cr in normal range and within 360 days    Creatinine, Ser  Date Value Ref Range Status  02/20/2023 0.88 0.57 - 1.00 mg/dL Final         Failed - HCT in normal range and within 360 days    Hematocrit  Date Value Ref Range Status  02/20/2023 35.0 34.0 - 46.6 % Final         Failed - AST in normal range and within 360 days    AST  Date Value Ref Range Status  02/20/2023 16 0 - 40 IU/L Final         Failed - ALT in normal range and within 360 days    ALT  Date Value Ref Range Status  02/20/2023 16 0 - 32 IU/L Final         Failed - eGFR is 30 or above and within 360 days    GFR calc Af Amer  Date Value Ref Range Status  02/17/2020 106 >59 mL/min/1.73 Final    Comment:    **[facility] currently reports eGFR in compliance with the current**   recommendations of the [employer]. [facility] will   update reporting as new guidelines are published from the NKF-ASN   Task force.    GFR calc non Af Amer  Date Value Ref Range Status  02/17/2020 92 >59 mL/min/1.73 Final   eGFR  Date Value Ref Range Status  02/20/2023 79 >59 mL/min/1.73 Final         Passed - Patient is  not pregnant      Passed - Valid encounter within last 12 months    Recent Outpatient Visits           2 weeks ago Chronic pain of left knee   [facility] Gareth Mliss FALCON, [place]

## 2024-07-14 ENCOUNTER — Encounter: Payer: Self-pay | Admitting: Nurse Practitioner

## 2024-07-18 ENCOUNTER — Inpatient Hospital Stay

## 2024-07-18 ENCOUNTER — Encounter: Payer: Self-pay | Admitting: Internal Medicine

## 2024-07-18 ENCOUNTER — Inpatient Hospital Stay: Attending: Internal Medicine | Admitting: Internal Medicine

## 2024-07-18 VITALS — BP 171/98 | HR 70 | Temp 99.0°F | Resp 18 | Ht 63.0 in | Wt 247.4 lb

## 2024-07-18 DIAGNOSIS — D649 Anemia, unspecified: Secondary | ICD-10-CM | POA: Diagnosis not present

## 2024-07-18 DIAGNOSIS — R21 Rash and other nonspecific skin eruption: Secondary | ICD-10-CM | POA: Insufficient documentation

## 2024-07-18 DIAGNOSIS — D509 Iron deficiency anemia, unspecified: Secondary | ICD-10-CM | POA: Insufficient documentation

## 2024-07-18 DIAGNOSIS — Z87891 Personal history of nicotine dependence: Secondary | ICD-10-CM | POA: Insufficient documentation

## 2024-07-18 NOTE — Assessment & Plan Note (Addendum)
#   Anemia- Hb- AUG 2024-iron  sat-7%; Ferritin -6;Hb-10- Mildly symptomatic.  Secondary to: History of heavy menstrual cycles- currently ceased.  Hemoglobin is 12 ferritin greater than 100.  Patient currently not on oral iron  pills-recommend going back of iron  pills if patient has ongoing menstrual cycles.  # Bilateral flexor arm rash likely allergic.  Recommend continuing hydrocortisone referred improved follow-up with PCP  # Patient currently only mildly symptomatic with mild fatigue.  Also she is not on oral iron .  Recommend taking oral iron .  However if not improved then recommend IV iron  infusions.  # DISPOSITION: # No venofer  # follow up as needed- Dr.B

## 2024-07-18 NOTE — Progress Notes (Signed)
 Fatigue/weakness: NO Dyspena: NO  Light headedness: NO  Blood in stool: NO

## 2024-07-18 NOTE — Progress Notes (Signed)
 [facility] CONSULT NOTE  Patient Care Team: Gareth Mliss FALCON, FNP as PCP - General (Nurse Practitioner) Rennie Cindy SAUNDERS, MD as Consulting Physician (Oncology)  CHIEF COMPLAINTS/PURPOSE OF CONSULTATION: ANEMIA   HEMATOLOGY HISTORY  # ANEMIA[Hb; MCV-platelets- WBC; Iron  sat; ferritin;  GFR- CT/US - ;  EGD- none/colonoscopy- 2017; and 2021 [Dr.Wohl]   Latest Reference Range & Units 02/20/23 15:46  Iron  27 - 159 ug/dL 32  UIBC 868 - 574 ug/dL 559 (H)  TIBC 749 - 549 ug/dL 527 (H)  Ferritin 15 - 150 ng/mL 10 (L)  Iron  Saturation 15 - 55 % 7 (LL)  (LL): Data is critically low (H): Data is abnormally high (L): Data is abnormally low  HISTORY OF PRESENTING ILLNESS: Patient ambulating-independently.   Judy Robinson 54 y.o.  female pleasant patient was been referred to us  for further evaluation of anemia   Discussed the use of AI scribe software for clinical note transcription with the patient, who gave verbal consent to proceed.  History of Present Illness   Judy Robinson is a 54 year old female with iron  deficiency anemia who presents for hematology follow-up and evaluation of a new rash.  Recent laboratory studies demonstrate improved iron  parameters, including hemoglobin 12.6 g/dL and ferritin increased from 6 at initial presentation to 113 currently. She discontinued oral iron  supplementation after her last visit due to normalization of laboratory values and has not required iron  infusions. She is not experiencing menorrhagia, with her last menstrual period in September, and previously underwent colonoscopy a few years ago.  She developed a rash on both arms one week ago, which initially improved with topical hydrocortisone but recurred with new papular lesions noted this morning. The rash is intermittently pruritic but was not itchy at the time of the visit and has not involved other areas. She continues to use over-the-counter hydrocortisone cream.        Review of Systems  Constitutional:  Negative for chills, diaphoresis, fever and weight loss.  HENT:  Negative for nosebleeds and sore throat.   Eyes:  Negative for double vision.  Respiratory:  Negative for cough, hemoptysis, sputum production, shortness of breath and wheezing.   Cardiovascular:  Negative for chest pain, palpitations, orthopnea and leg swelling.  Gastrointestinal:  Negative for abdominal pain, blood in stool, constipation, diarrhea, heartburn, melena, nausea and vomiting.  Genitourinary:  Negative for dysuria, frequency and urgency.  Musculoskeletal:  Negative for back pain and joint pain.  Skin: Negative.  Negative for itching and rash.  Neurological:  Negative for dizziness, tingling, focal weakness, weakness and headaches.  Endo/Heme/Allergies:  Does not bruise/bleed easily.  Psychiatric/Behavioral:  Negative for depression. The patient is not nervous/anxious and does not have insomnia.    MEDICAL HISTORY:  Past Medical History:  Diagnosis Date   Arthritis 11/2018   left knee   Essential hypertension, benign 02/03/2018   Obesity, Class III, BMI 40-49.9 (morbid obesity) (HCC) 02/09/2015   Torn meniscus     SURGICAL HISTORY: Past Surgical History:  Procedure Laterality Date   CESAREAN SECTION     COLONOSCOPY WITH PROPOFOL  N/A 03/25/2016   Procedure: COLONOSCOPY WITH PROPOFOL ;  Surgeon: Rogelia Copping, MD;  Location: [facility] ENDOSCOPY;  Service: Endoscopy;  Laterality: N/A;   COLONOSCOPY WITH PROPOFOL  N/A 05/03/2020   Procedure: COLONOSCOPY WITH PROPOFOL ;  Surgeon: Copping Rogelia, MD;  Location: [facility] ENDOSCOPY;  Service: Endoscopy;  Laterality: N/A;   TUBAL LIGATION      SOCIAL HISTORY: Social History   Socioeconomic History  Marital status: Married    Spouse name: Not on file   Number of children: Not on file   Years of education: Not on file   Highest education level: Not on file  Occupational History   Not on file  Tobacco Use   Smoking status: Former     Current packs/day: 0.00    Average packs/day: 0.3 packs/day for 1 year (0.3 ttl pk-yrs)    Types: Cigarettes    Start date: 01/29/2010    Quit date: 01/30/2011    Years since quitting: 13.4   Smokeless tobacco: Never  Vaping Use   Vaping status: Never Used  Substance and Sexual Activity   Alcohol use: Yes    Alcohol/week: 4.0 - 7.0 standard drinks of alcohol    Types: 4 - 7 Standard drinks or equivalent per week    Comment: ocassional   Drug use: No   Sexual activity: Yes    Partners: Male    Birth control/protection: Surgical    Comment: BTL  Other Topics Concern   Not on file  Social History Narrative   Not on file   Social Drivers of Health   Tobacco Use: Medium Risk (07/18/2024)   Patient History    Smoking Tobacco Use: Former    Smokeless Tobacco Use: Never    Passive Exposure: Not on file  Financial Resource Strain: Low Risk (04/03/2023)   Overall Financial Resource Strain (CARDIA)    Difficulty of Paying Living Expenses: Not hard at all  Food Insecurity: No Food Insecurity (04/03/2023)   Hunger Vital Sign    Worried About Running Out of Food in the Last Year: Never true    Ran Out of Food in the Last Year: Never true  Transportation Needs: No Transportation Needs (04/03/2023)   PRAPARE - Administrator, Civil Service (Medical): No    Lack of Transportation (Non-Medical): No  Physical Activity: Inactive (04/03/2023)   Exercise Vital Sign    Days of Exercise per Week: 0 days    Minutes of Exercise per Session: 0 min  Stress: No Stress Concern Present (04/03/2023)   [employer] of Occupational Health - Occupational Stress Questionnaire    Feeling of Stress : Only a little  Social Connections: Moderately Integrated (04/03/2023)   Social Connection and Isolation Panel    Frequency of Communication with Friends and Family: More than three times a week    Frequency of Social Gatherings with Friends and Family: More than three times a week    Attends  Religious Services: More than 4 times per year    Active Member of [employer] or Organizations: No    Attends Banker Meetings: Never    Marital Status: Married  Catering Manager Violence: Not At Risk (04/03/2023)   Humiliation, Afraid, Rape, and Kick questionnaire    Fear of Current or Ex-Partner: No    Emotionally Abused: No    Physically Abused: No    Sexually Abused: No  Depression (PHQ2-9): Low Risk (07/18/2024)   Depression (PHQ2-9)    PHQ-2 Score: 1  Recent Concern: Depression (PHQ2-9) - Medium Risk (05/24/2024)   Depression (PHQ2-9)    PHQ-2 Score: 7  Alcohol Screen: Low Risk (04/03/2023)   Alcohol Screen    Last Alcohol Screening Score (AUDIT): 1  Housing: Low Risk (04/03/2023)   Housing    Last Housing Risk Score: 0  Utilities: Not At Risk (04/03/2023)   AHC Utilities    Threatened with loss of utilities: No  Health Literacy: Adequate Health Literacy (04/03/2023)   B1300 Health Literacy    Frequency of need for help with medical instructions: Never    FAMILY HISTORY: Family History  Problem Relation Age of Onset   Hypertension Mother    Thyroid disease Mother    Diabetes Brother    Hypertension Brother    Heart attack Brother    Heart disease Brother    Heart disease Father 59       heart attack   Hypertension Father    Breast cancer Paternal Aunt    Asthma Maternal Grandmother    Diabetes Maternal Grandmother    Cancer Maternal Grandfather        prostate?   Diabetes Maternal Grandfather    Heart attack Paternal Grandmother     ALLERGIES:  has no known allergies.  MEDICATIONS:  Current Outpatient Medications  Medication Sig Dispense Refill   celecoxib  (CELEBREX ) 100 MG capsule Take 1 capsule by mouth twice daily 20 capsule 0   medroxyPROGESTERone  (PROVERA ) 5 MG tablet Take 1 tablet (5 mg total) by mouth daily. Take for 2 weeks, then stop for 2 weeks then repeat. First day February 19 14 tablet 12   No current facility-administered medications  for this visit.     PHYSICAL EXAMINATION:   Vitals:   07/18/24 1315 07/18/24 1324  BP: (!) 183/86 (!) 171/98  Pulse: 70   Resp: 18   Temp: 99 F (37.2 C)   SpO2: 100%    Filed Weights   07/18/24 1315  Weight: 247 lb 6.4 oz (112.2 kg)    Physical Exam Vitals and nursing note reviewed.  HENT:     Head: Normocephalic and atraumatic.     Mouth/Throat:     Pharynx: Oropharynx is clear.  Eyes:     Extraocular Movements: Extraocular movements intact.     Pupils: Pupils are equal, round, and reactive to light.  Cardiovascular:     Rate and Rhythm: Normal rate and regular rhythm.  Pulmonary:     Comments: Decreased breath sounds bilaterally.  Abdominal:     Palpations: Abdomen is soft.  Musculoskeletal:        General: Normal range of motion.     Cervical back: Normal range of motion.  Skin:    General: Skin is warm.  Neurological:     General: No focal deficit present.     Mental Status: She is alert and oriented to person, place, and time.  Psychiatric:        Behavior: Behavior normal.        Judgment: Judgment normal.      LABORATORY DATA:  I have reviewed the data as listed Lab Results  Component Value Date   WBC 9.3 02/20/2023   HGB 10.7 (L) 02/20/2023   HCT 35.0 02/20/2023   MCV 79 02/20/2023   PLT 356 02/20/2023   No results for input(s): NA, K, CL, CO2, GLUCOSE, BUN, CREATININE, CALCIUM, GFRNONAA, GFRAA, PROT, ALBUMIN, AST, ALT, ALKPHOS, BILITOT, BILIDIR, IBILI in the last 8760 hours.    No results found.  ASSESSMENT & PLAN:   Symptomatic anemia # Anemia- Hb- AUG 2024-iron  sat-7%; Ferritin -6;Hb-10- Mildly symptomatic.  Secondary to: History of heavy menstrual cycles- currently ceased.  Hemoglobin is 12 ferritin greater than 100.  Patient currently not on oral iron  pills-recommend going back of iron  pills if patient has ongoing menstrual cycles.  # Bilateral flexor arm rash likely allergic.  Recommend  continuing hydrocortisone referred improved follow-up with PCP  #  Patient currently only mildly symptomatic with mild fatigue.  Also she is not on oral iron .  Recommend taking oral iron .  However if not improved then recommend IV iron  infusions.  # DISPOSITION: # No venofer  # follow up as needed- Dr.B    All questions were answered. The patient knows to call the clinic with any problems, questions or concerns.    Cindy JONELLE Joe, MD 07/18/2024 2:21 PM

## 2024-07-22 ENCOUNTER — Telehealth: Admitting: Physician Assistant

## 2024-07-22 DIAGNOSIS — L2082 Flexural eczema: Secondary | ICD-10-CM

## 2024-07-22 MED ORDER — TRIAMCINOLONE ACETONIDE 0.1 % EX CREA: 1.0000 | TOPICAL_CREAM | Freq: Two times a day (BID) | CUTANEOUS | 0 refills | Status: AC

## 2024-07-22 NOTE — Patient Instructions (Signed)
 " Judy Robinson Robinson, thank you for joining Judy Robinson CHRISTELLA Dickinson, PA-C for today's virtual visit.  While this provider is not your primary care provider (PCP), if your PCP is located in our provider database this encounter information will be shared with them immediately following your visit.   A [facility] MyChart account gives you access to today's visit and all your visits, tests, and labs performed at [facility]  click here if you don't have a [facility] MyChart account or go to [email or web address]  Consent: (Patient) Judy Robinson Robinson provided verbal consent for this virtual visit at the beginning of the encounter.  Current Medications:  Current Outpatient Medications:    triamcinolone  cream (KENALOG ) 0.1 %, Apply 1 Application topically 2 (two) times daily., Disp: 45 g, Rfl: 0   celecoxib  (CELEBREX ) 100 MG capsule, Take 1 capsule by mouth twice daily, Disp: 20 capsule, Rfl: 0   medroxyPROGESTERone  (PROVERA ) 5 MG tablet, Take 1 tablet (5 mg total) by mouth daily. Take for 2 weeks, then stop for 2 weeks then repeat. First day February 19, Disp: 14 tablet, Rfl: 12   Medications ordered in this encounter:  Meds ordered this encounter  Medications   triamcinolone  cream (KENALOG ) 0.1 %    Sig: Apply 1 Application topically 2 (two) times daily.    Dispense:  45 g    Refill:  0    Supervising Provider:   BLAISE ALEENE KIDD [[id number]]     *If you need refills on other medications prior to your next appointment, please contact your pharmacy*  Follow-Up: Call back or seek an in-person evaluation if the symptoms worsen or if the condition fails to improve as anticipated.  [facility] [phone]  Other Instructions  Itchy, Irritated Skin (Eczema): What to Know Eczema is a group of skin conditions that cause rough and inflamed skin. There are different types of eczema. They each have different triggers, symptoms, and treatments. Eczema is not  contagious, so it doesn't spread from person to person. It can appear on different parts of your body at different times. It doesn't look the same on everyone. What are the causes? The exact cause of eczema isn't known. Things that can make it worse includes: Irritants. Allergens. What are the signs or symptoms? Symptoms depend on the type of eczema you have. They can range from mild to very bad. Symptoms often include: Itchiness. Dry skin. Rash or skin bumps. Swelling. Crusty, flaky, or scaly patches. Thick patches of skin. Oozing skin or blisters. How is this diagnosed? This condition may be diagnosed based on: Symptoms. Physical exam. Medical history. Skin patch tests that use allergen patches on your back to check for allergic reactions. You may need to see a skin specialist called a dermatologist. This specialist can help diagnose and treat this condition. How is this treated? There's no cure for eczema, but you can manage your symptoms. Treatment depends on the type of eczema you have. Options may include: Medicines to lessen itching (antihistamines). Medicine to put on your skin to lessen swelling and irritation (corticosteroid creams or ointments). Light therapy, also called phototherapy. The affected skin is put under ultraviolet (UV) light. Medicines may be prescribed or purchased at the store. This will depend on the strength that's needed. Follow these instructions at home: Skin Care Use skin creams or lotions as told. Do not scratch your skin. This can make your rash worse. Keep fingernails short to avoid scratching open the skin. General instructions  Take or apply your medicines as told. Avoid triggers and allergens. Treat symptoms fast if you have a flare-up. Keep all follow-up visits to make sure your treatment plan is working. Where to find more information American Academy of Dermatology: marketingsheets.si National Eczema Association: nationaleczema.org The Society for  Pediatric Dermatology: pedsderm.net Contact a health care provider if: You have very bad itching even with treatment. You often scratch your skin until it bleeds. Your rash looks different than normal. You have a fever. You have symptoms that don't go away with treatment. You have more redness, pain, or swelling over the affected skin. You have warmth or pus coming from the affected skin. This information is not intended to replace advice given to you by your health care provider. Make sure you discuss any questions you have with your health care provider. Document Revised: 11/25/2022 Document Reviewed: 11/25/2022 Elsevier Patient Education  2024 Elsevier Inc.   If you have been instructed to have an in-person evaluation today at a local Urgent Care facility, please use the link below. It will take you to a list of all of our available [facility], including address, phone number and hours of operation. Please do not delay care.  [facility]  If you or a family member do not have a primary care provider, use the link below to schedule a visit and establish care. When you choose a [facility] primary care physician or advanced practice provider, you gain a long-term partner in health. Find a Primary Care Provider  Learn more about [facility]'s in-office and virtual care options: [facility] - Get Care Now "

## 2024-07-22 NOTE — Progress Notes (Signed)
 " Virtual Visit Consent   Judy Robinson, you are scheduled for a virtual visit with a [facility] provider today. Just as with appointments in the office, your consent must be obtained to participate. Your consent will be active for this visit and any virtual visit you may have with one of our providers in the next 365 days. If you have a MyChart account, a copy of this consent can be sent to you electronically.  As this is a virtual visit, video technology does not allow for your provider to perform a traditional examination. This may limit your provider's ability to fully assess your condition. If your provider identifies any concerns that need to be evaluated in person or the need to arrange testing (such as labs, EKG, etc.), we will make arrangements to do so. Although advances in technology are sophisticated, we cannot ensure that it will always work on either your end or our end. If the connection with a video visit is poor, the visit may have to be switched to a telephone visit. With either a video or telephone visit, we are not always able to ensure that we have a secure connection.  By engaging in this virtual visit, you consent to the provision of healthcare and authorize for your insurance to be billed (if applicable) for the services provided during this visit. Depending on your insurance coverage, you may receive a charge related to this service.  I need to obtain your verbal consent now. Are you willing to proceed with your visit today? Judy Robinson has provided verbal consent on 07/22/2024 for a virtual visit (video or telephone). Judy CHRISTELLA Dickinson, PA-C  Date: 07/22/2024 2:39 PM   Virtual Visit via Video Note   I, Judy Robinson, connected with  Judy Robinson  ([id number], 1970-09-26) on 07/22/24 at  2:30 PM EST by a video-enabled telemedicine application and verified that I am speaking with the correct person using two identifiers.  Location: Patient:  Virtual Visit Location Patient: Home Provider: Virtual Visit Location Provider: Home Office   I discussed the limitations of evaluation and management by telemedicine and the availability of in person appointments. The patient expressed understanding and agreed to proceed.    History of Present Illness: Judy Robinson is a 54 y.o. who identifies as a female who was assigned female at birth, and is being seen today for rash in antecubital fossa bilaterally (R>L).  HPI: Rash This is a new problem. The current episode started 1 to 4 weeks ago (2 weeks). The problem has been waxing and waning since onset. The affected locations include the left elbow and right elbow (R>L). The rash is characterized by itchiness, redness and blistering. She was exposed to nothing. Pertinent negatives include no congestion, cough, facial edema, fatigue, fever, shortness of breath or sore throat. Past treatments include topical steroids (cortisone 10). The treatment provided no relief.     Problems:  Patient Active Problem List   Diagnosis Date Noted   Symptomatic anemia 02/27/2023   Iron  deficiency anemia 02/20/2023   Menorrhagia with irregular cycle 08/15/2022   Dysthymia 08/15/2022   Hyperlipidemia 04/10/2021   History of colonic polyps    Benign neoplasm of sigmoid colon    Everitt Quervain's disease (radial styloid tenosynovitis) 02/09/2015   Acne inversa 02/09/2015   Obesity, Class III, BMI 40-49.9 (morbid obesity) (HCC) 02/09/2015    Allergies: Allergies[1] Medications: Current Medications[2]  Observations/Objective: Patient is well-developed, well-nourished in no acute distress.  Resting comfortably at home.  Head is normocephalic, atraumatic.  No labored breathing.  Speech is clear and coherent with logical content.  Patient is alert and oriented at baseline.    Assessment and Plan: 1. Flexural eczema (Primary) - triamcinolone  cream (KENALOG ) 0.1 %; Apply 1 Application topically 2 (two)  times daily.  Dispense: 45 g; Refill: 0  - Rash seems consistent with Eczema - Add Triamcinolone  cream - Discussed using good moisturizer like Aquaphor, Eucerin, Lubriderm - Avoid hot water and drying chemicals - Seek further evaluation if worsening or fails to improve  Follow Up Instructions: I discussed the assessment and treatment plan with the patient. The patient was provided an opportunity to ask questions and all were answered. The patient agreed with the plan and demonstrated an understanding of the instructions.  A copy of instructions were sent to the patient via MyChart unless otherwise noted below.    The patient was advised to call back or seek an in-person evaluation if the symptoms worsen or if the condition fails to improve as anticipated.    Judy HERO Harris Penton, PA-C     [1] No Known Allergies [2]  Current Outpatient Medications:    triamcinolone  cream (KENALOG ) 0.1 %, Apply 1 Application topically 2 (two) times daily., Disp: 45 g, Rfl: 0   celecoxib  (CELEBREX ) 100 MG capsule, Take 1 capsule by mouth twice daily, Disp: 20 capsule, Rfl: 0   medroxyPROGESTERone  (PROVERA ) 5 MG tablet, Take 1 tablet (5 mg total) by mouth daily. Take for 2 weeks, then stop for 2 weeks then repeat. First day February 19, Disp: 14 tablet, Rfl: 12  "
